# Patient Record
Sex: Male | Born: 1935 | Race: White | Hispanic: No | Smoking: Never smoker
Health system: Southern US, Community
[De-identification: ages and names within clinical notes are randomized; demographics above are authoritative.]

## PROBLEM LIST (undated history)

## (undated) DIAGNOSIS — F32A Depression, unspecified: Secondary | ICD-10-CM

## (undated) DIAGNOSIS — M1990 Unspecified osteoarthritis, unspecified site: Secondary | ICD-10-CM

## (undated) DIAGNOSIS — E0590 Thyrotoxicosis, unspecified without thyrotoxic crisis or storm: Secondary | ICD-10-CM

## (undated) DIAGNOSIS — I219 Acute myocardial infarction, unspecified: Secondary | ICD-10-CM

## (undated) DIAGNOSIS — K219 Gastro-esophageal reflux disease without esophagitis: Secondary | ICD-10-CM

## (undated) DIAGNOSIS — F329 Major depressive disorder, single episode, unspecified: Secondary | ICD-10-CM

## (undated) HISTORY — PX: INGUINAL HERNIA REPAIR: SHX194

## (undated) HISTORY — PX: ESOPHAGEAL DILATION: SHX303

## (undated) HISTORY — PX: EYE SURGERY: SHX253

---

## 1990-06-13 DIAGNOSIS — I219 Acute myocardial infarction, unspecified: Secondary | ICD-10-CM

## 1990-06-13 HISTORY — DX: Acute myocardial infarction, unspecified: I21.9

## 1998-12-29 ENCOUNTER — Ambulatory Visit (HOSPITAL_COMMUNITY): Admission: RE | Admit: 1998-12-29 | Discharge: 1998-12-29 | Payer: Self-pay | Admitting: Internal Medicine

## 1999-07-15 ENCOUNTER — Encounter: Payer: Self-pay | Admitting: Internal Medicine

## 1999-07-15 ENCOUNTER — Encounter: Admission: RE | Admit: 1999-07-15 | Discharge: 1999-07-15 | Payer: Self-pay | Admitting: Internal Medicine

## 1999-08-11 ENCOUNTER — Encounter: Payer: Self-pay | Admitting: Gastroenterology

## 1999-08-11 ENCOUNTER — Ambulatory Visit (HOSPITAL_COMMUNITY): Admission: RE | Admit: 1999-08-11 | Discharge: 1999-08-11 | Payer: Self-pay | Admitting: Gastroenterology

## 2008-05-02 ENCOUNTER — Ambulatory Visit (HOSPITAL_COMMUNITY): Admission: RE | Admit: 2008-05-02 | Discharge: 2008-05-02 | Payer: Self-pay | Admitting: General Surgery

## 2010-10-26 NOTE — Op Note (Signed)
NAME:  George, Reid NO.:  [id number]   MEDICAL RECORD NO.:  [id number]          PATIENT TYPE:  AMB   LOCATION:  DAY                          FACILITY:  [facility]   PHYSICIAN:  Adolph Pollack, M.D.DATE OF BIRTH:  [birth year]   DATE OF PROCEDURE:  05/02/2008  DATE OF DISCHARGE:                               OPERATIVE REPORT   PREOPERATIVE DIAGNOSIS:  Right inguinal hernia.   POSTOPERATIVE DIAGNOSIS:  Right inguinal hernia.   PROCEDURE:  Right inguinal hernia repair with mesh.   SURGEON:  Adolph Pollack, M.D.   ANESTHESIA:  General/LMA plus Marcaine local.   INDICATIONS:  This 77 year old male has noted a right inguinal bulge  that is somewhat bothersome to him.  He has an inguinal hernia on exam  and now presents for repair.  We have discussed the procedure, the risks  and aftercare preoperatively.   TECHNIQUE:  He was seen in the holding area and the right groin marked  my initials.  He was then brought to the operating room, placed supine  on the operating table and the anesthetic was administered.  The hair on  the right groin was clipped and the area was sterilely prepped and  draped.  Marcaine was infiltrated superficially and deep in the right  groin.  Right groin incision was made through the skin, subcutaneous  tissue and Scarpa fascia until the external oblique aponeurosis was  exposed.  Local anesthetic was infiltrated deep to the external oblique  aponeurosis.  Incision was made in the external oblique aponeurosis  through the external ring medially and up toward the anterior superior  iliac spine laterally.  Using blunt dissection the shelving edge of the  inguinal ligament was identified inferiorly and the internal oblique  aponeurosis identified superiorly.  The ilioinguinal nerve was  identified and retracted inferiorly out of the plane of dissection.   Using blunt dissection I isolated the spermatic cord and noted an  indirect sac  adherent to it.  I then dissected the indirect sac free  from the cord and reduced it through the patulous internal ring.   Following this a piece of 3 x 6 inches polypropylene mesh was brought  into the field and anchored 2 cm medial to the pubic tubercle with 2-0  Prolene suture.  The inferior aspect of the mesh was then anchored to  the shelving edge of the inguinal ligament with a running 2-0 Prolene  suture up to level 1-2 cm lateral to the internal ring.  A slit was cut  the mesh and two tails wrapped around the cord.  The superior aspect of  the mesh was then anchored to the internal oblique aponeurosis with  interrupted 2-0 Vicryl sutures.  Two tails of the mesh were crossed  creating a new internal ring and these were anchored to the shelving  edge of the inguinal ligament with 2-0 Prolene suture.  The tip of the  hemostat could be placed through the new aperture.   The lateral aspect of mesh was then tucked deep to the external oblique  aponeurosis.  The  cord and nerve root were placed in their usual  positions.  The external oblique aponeurosis was closed with running 3-0  Vicryl suture.  Ellamae Sia was closed with closed with running 2-0 Vicryl  suture.  The skin was closed with a 4-0 Monocryl subcuticular stitch,  followed by Steri-Strips and sterile dressing.   He tolerated the procedure without any apparent complications.  The  right testicle was in its normal position in the scrotum.  He was taken  to the recovery room in satisfactory condition.      Adolph Pollack, M.D.  Electronically Signed     TJR/MEDQ  D:  05/02/2008  T:  05/02/2008  Job:  [id number]

## 2011-03-15 LAB — COMPREHENSIVE METABOLIC PANEL
ALT: 20
AST: 31
Albumin: 4.5
Alkaline Phosphatase: 67
BUN: 15
CO2: 26
Calcium: 9.8
Chloride: 105
Creatinine, Ser: 1.07
GFR calc Af Amer: >60
GFR calc non Af Amer: >60
Glucose, Bld: 131 — ABNORMAL HIGH
Potassium: 3.9
Sodium: 140
Total Bilirubin: 0.7
Total Protein: 7.6

## 2011-03-15 LAB — DIFFERENTIAL
Basophils Absolute: 0
Basophils Relative: 1
Eosinophils Absolute: 0.1
Eosinophils Relative: 2
Lymphocytes Relative: 31
Lymphs Abs: 1.8
Monocytes Absolute: 0.5
Monocytes Relative: 8
Neutro Abs: 3.3
Neutrophils Relative %: 58

## 2011-03-15 LAB — CBC
HCT: 42.1
Hemoglobin: 14.2
MCHC: 33.7
MCV: 99.8
Platelets: 145 — ABNORMAL LOW
RBC: 4.22
RDW: 13
WBC: 5.7

## 2011-03-15 LAB — PROTIME-INR
INR: 1
Prothrombin Time: 13.2

## 2012-05-15 ENCOUNTER — Other Ambulatory Visit: Payer: Self-pay | Admitting: Neurosurgery

## 2012-05-15 ENCOUNTER — Encounter (HOSPITAL_COMMUNITY): Payer: Self-pay | Admitting: Pharmacy Technician

## 2012-05-17 ENCOUNTER — Encounter (HOSPITAL_COMMUNITY): Payer: Self-pay

## 2012-05-17 ENCOUNTER — Encounter (HOSPITAL_COMMUNITY)
Admission: RE | Admit: 2012-05-17 | Discharge: 2012-05-17 | Disposition: A | Discharge status: Home / Self Care | Payer: Medicare Other | Source: Ambulatory Visit | Attending: Neurosurgery | Admitting: Neurosurgery

## 2012-05-17 ENCOUNTER — Encounter (HOSPITAL_COMMUNITY)
Admission: RE | Admit: 2012-05-17 | Discharge: 2012-05-17 | Disposition: A | Discharge status: Home / Self Care | Payer: Medicare Other | Source: Ambulatory Visit | Attending: Anesthesiology | Admitting: Anesthesiology

## 2012-05-17 HISTORY — DX: Thyrotoxicosis, unspecified without thyrotoxic crisis or storm: E05.90

## 2012-05-17 HISTORY — DX: Acute myocardial infarction, unspecified: I21.9

## 2012-05-17 HISTORY — DX: Unspecified osteoarthritis, unspecified site: M19.90

## 2012-05-17 HISTORY — DX: Major depressive disorder, single episode, unspecified: F32.9

## 2012-05-17 HISTORY — DX: Gastro-esophageal reflux disease without esophagitis: K21.9

## 2012-05-17 HISTORY — DX: Depression, unspecified: F32.A

## 2012-05-17 LAB — CBC
MCH: 33.7 pg (ref 26.0–34.0)
Platelets: 182 10*3/uL (ref 150–400)
RBC: 3.59 MIL/uL — ABNORMAL LOW (ref 4.22–5.81)
WBC: 4.5 10*3/uL (ref 4.0–10.5)

## 2012-05-17 LAB — BASIC METABOLIC PANEL
Calcium: 9.6 mg/dL (ref 8.4–10.5)
GFR calc Af Amer: 84 mL/min — ABNORMAL LOW (ref >90)
GFR calc non Af Amer: 73 mL/min — ABNORMAL LOW (ref >90)
Glucose, Bld: 104 mg/dL — ABNORMAL HIGH (ref 70–99)
Potassium: 4.2 mEq/L (ref 3.5–5.1)
Sodium: 140 mEq/L (ref 135–145)

## 2012-05-17 LAB — SURGICAL PCR SCREEN
MRSA, PCR: NEGATIVE
Staphylococcus aureus: NEGATIVE

## 2012-05-17 MED ORDER — CEFAZOLIN SODIUM-DEXTROSE 2-3 GM-% IV SOLR
2.0000 g | INTRAVENOUS | Status: AC
  Administered 2012-05-18: 2 g via INTRAVENOUS
  Filled 2012-05-17: qty 50

## 2012-05-17 NOTE — Pre-Procedure Instructions (Signed)
20 George Reid  05/17/2012   Your procedure is scheduled on:  Friday, December 6th.  Report to [facility] at 8:15AM.  Call this number if you have problems the morning of surgery: [phone]   Remember:Nothing to eat or drink after Midnight.     Take these medicines the morning of surgery with A SIP OF WATER: Levothyroxine (Synthyroid), Omeprazole (Prilosec).                        Do not wear jewelry, make-up or nail polish.  Do not wear lotions, powders, or perfumes. You may wear deodorant.  Do not shave 48 hours prior to surgery. Men may shave face and neck.  Do not bring valuables to the hospital.  Contacts, dentures or bridgework may not be worn into surgery.  Leave suitcase in the car. After surgery it may be brought to your room.  For patients admitted to the hospital, checkout time is 11:00 AM the day of discharge.   Patients discharged the day of surgery will not be allowed to drive home.    Special Instructions: Shower with CHG wash (Bactoshield) tonight and again in the am prior to arriving to hospital.   Please read over the following fact sheets that you were given: Pain Booklet, Coughing and Deep Breathing and Surgical Site Infection Prevention

## 2012-05-18 ENCOUNTER — Encounter (HOSPITAL_COMMUNITY): Payer: Self-pay | Admitting: *Deleted

## 2012-05-18 ENCOUNTER — Inpatient Hospital Stay (HOSPITAL_COMMUNITY): Payer: Medicare Other

## 2012-05-18 ENCOUNTER — Encounter (HOSPITAL_COMMUNITY): Payer: Self-pay | Admitting: Certified Registered"

## 2012-05-18 ENCOUNTER — Inpatient Hospital Stay (HOSPITAL_COMMUNITY)
Admission: RE | Admit: 2012-05-18 | Discharge: 2012-05-19 | DRG: 491 | Disposition: A | Discharge status: Home / Self Care | Payer: Medicare Other | Source: Ambulatory Visit | Attending: Neurosurgery | Admitting: Neurosurgery

## 2012-05-18 ENCOUNTER — Encounter (HOSPITAL_COMMUNITY)
Admission: RE | Disposition: A | Discharge status: Home / Self Care | Payer: Self-pay | Source: Ambulatory Visit | Attending: Neurosurgery

## 2012-05-18 ENCOUNTER — Inpatient Hospital Stay (HOSPITAL_COMMUNITY): Payer: Medicare Other | Admitting: Certified Registered"

## 2012-05-18 DIAGNOSIS — Z7982 Long term (current) use of aspirin: Secondary | ICD-10-CM

## 2012-05-18 DIAGNOSIS — E0590 Thyrotoxicosis, unspecified without thyrotoxic crisis or storm: Secondary | ICD-10-CM | POA: Diagnosis present

## 2012-05-18 DIAGNOSIS — I10 Essential (primary) hypertension: Secondary | ICD-10-CM | POA: Diagnosis present

## 2012-05-18 DIAGNOSIS — M5126 Other intervertebral disc displacement, lumbar region: Principal | ICD-10-CM | POA: Diagnosis present

## 2012-05-18 DIAGNOSIS — K219 Gastro-esophageal reflux disease without esophagitis: Secondary | ICD-10-CM | POA: Diagnosis present

## 2012-05-18 DIAGNOSIS — Z79899 Other long term (current) drug therapy: Secondary | ICD-10-CM

## 2012-05-18 DIAGNOSIS — I252 Old myocardial infarction: Secondary | ICD-10-CM

## 2012-05-18 HISTORY — PX: LUMBAR LAMINECTOMY/DECOMPRESSION MICRODISCECTOMY: SHX5026

## 2012-05-18 SURGERY — LUMBAR LAMINECTOMY/DECOMPRESSION MICRODISCECTOMY 1 LEVEL
Anesthesia: General | Laterality: Right | Wound class: Clean

## 2012-05-18 MED ORDER — CYCLOBENZAPRINE HCL 10 MG PO TABS: 10.0000 mg | ORAL_TABLET | Freq: Three times a day (TID) | ORAL | Status: DC | PRN

## 2012-05-18 MED ORDER — HYDROMORPHONE HCL PF 1 MG/ML IJ SOLN: 0.2500 mg | INTRAMUSCULAR | Status: DC | PRN

## 2012-05-18 MED ORDER — MORPHINE SULFATE 2 MG/ML IJ SOLN: 1.0000 mg | INTRAMUSCULAR | Status: DC | PRN

## 2012-05-18 MED ORDER — ATORVASTATIN CALCIUM 40 MG PO TABS
40.0000 mg | ORAL_TABLET | Freq: Every day | ORAL | Status: DC
  Filled 2012-05-18: qty 1

## 2012-05-18 MED ORDER — LEVOTHYROXINE SODIUM 75 MCG PO TABS: 75.0000 ug | ORAL_TABLET | Freq: Every day | ORAL | Status: DC

## 2012-05-18 MED ORDER — GLYCOPYRROLATE 0.2 MG/ML IJ SOLN
INTRAMUSCULAR | Status: DC | PRN
  Administered 2012-05-18: 0.4 mg via INTRAVENOUS

## 2012-05-18 MED ORDER — HEMOSTATIC AGENTS (NO CHARGE) OPTIME
TOPICAL | Status: DC | PRN
  Administered 2012-05-18: 1 via TOPICAL

## 2012-05-18 MED ORDER — LIDOCAINE HCL (CARDIAC) 20 MG/ML IV SOLN
INTRAVENOUS | Status: DC | PRN
  Administered 2012-05-18: 80 mg via INTRAVENOUS

## 2012-05-18 MED ORDER — PROPOFOL 10 MG/ML IV BOLUS
INTRAVENOUS | Status: DC | PRN
  Administered 2012-05-18: 140 mg via INTRAVENOUS

## 2012-05-18 MED ORDER — 0.9 % SODIUM CHLORIDE (POUR BTL) OPTIME
TOPICAL | Status: DC | PRN
  Administered 2012-05-18: 1000 mL

## 2012-05-18 MED ORDER — ACETAMINOPHEN 10 MG/ML IV SOLN
1000.0000 mg | Freq: Four times a day (QID) | INTRAVENOUS | Status: DC
  Administered 2012-05-18 – 2012-05-19 (×3): 1000 mg via INTRAVENOUS
  Filled 2012-05-18 (×4): qty 100

## 2012-05-18 MED ORDER — LIDOCAINE-EPINEPHRINE 0.5 %-1:200000 IJ SOLN
INTRAMUSCULAR | Status: DC | PRN
  Administered 2012-05-18: 10 mL

## 2012-05-18 MED ORDER — SODIUM CHLORIDE 0.9 % IJ SOLN
3.0000 mL | Freq: Two times a day (BID) | INTRAMUSCULAR | Status: DC
  Administered 2012-05-18: 3 mL via INTRAVENOUS

## 2012-05-18 MED ORDER — POTASSIUM CHLORIDE IN NACL 20-0.9 MEQ/L-% IV SOLN
INTRAVENOUS | Status: DC
  Administered 2012-05-18: 18:00:00 via INTRAVENOUS
  Filled 2012-05-18 (×4): qty 1000

## 2012-05-18 MED ORDER — MENTHOL 3 MG MT LOZG: 1.0000 | LOZENGE | OROMUCOSAL | Status: DC | PRN

## 2012-05-18 MED ORDER — PANTOPRAZOLE SODIUM 40 MG PO TBEC
40.0000 mg | DELAYED_RELEASE_TABLET | Freq: Every day | ORAL | Status: DC
  Administered 2012-05-19: 40 mg via ORAL
  Filled 2012-05-18: qty 1

## 2012-05-18 MED ORDER — LIDOCAINE HCL 4 % MT SOLN
OROMUCOSAL | Status: DC | PRN
  Administered 2012-05-18: 4 mL via TOPICAL

## 2012-05-18 MED ORDER — HYDROCODONE-ACETAMINOPHEN 5-325 MG PO TABS: 1.0000 | ORAL_TABLET | Freq: Four times a day (QID) | ORAL | Status: DC | PRN

## 2012-05-18 MED ORDER — VERAPAMIL HCL ER 240 MG PO TBCR
240.0000 mg | EXTENDED_RELEASE_TABLET | Freq: Every day | ORAL | Status: DC
  Administered 2012-05-18: 240 mg via ORAL
  Filled 2012-05-18 (×2): qty 1

## 2012-05-18 MED ORDER — ROCURONIUM BROMIDE 100 MG/10ML IV SOLN
INTRAVENOUS | Status: DC | PRN
  Administered 2012-05-18: 40 mg via INTRAVENOUS

## 2012-05-18 MED ORDER — ARTIFICIAL TEARS OP OINT
TOPICAL_OINTMENT | OPHTHALMIC | Status: DC | PRN
  Administered 2012-05-18: 1 via OPHTHALMIC

## 2012-05-18 MED ORDER — ONDANSETRON HCL 4 MG/2ML IJ SOLN: 4.0000 mg | INTRAMUSCULAR | Status: DC | PRN

## 2012-05-18 MED ORDER — ASPIRIN 81 MG PO TABS: 81.0000 mg | ORAL_TABLET | Freq: Every day | ORAL | Status: DC

## 2012-05-18 MED ORDER — LACTATED RINGERS IV SOLN
INTRAVENOUS | Status: DC | PRN
  Administered 2012-05-18 (×2): via INTRAVENOUS

## 2012-05-18 MED ORDER — PHENOL 1.4 % MT LIQD: 1.0000 | OROMUCOSAL | Status: DC | PRN

## 2012-05-18 MED ORDER — ONDANSETRON HCL 4 MG/2ML IJ SOLN
INTRAMUSCULAR | Status: DC | PRN
  Administered 2012-05-18: 4 mg via INTRAVENOUS

## 2012-05-18 MED ORDER — NEOSTIGMINE METHYLSULFATE 1 MG/ML IJ SOLN
INTRAMUSCULAR | Status: DC | PRN
  Administered 2012-05-18: 3 mg via INTRAVENOUS

## 2012-05-18 MED ORDER — EPHEDRINE SULFATE 50 MG/ML IJ SOLN
INTRAMUSCULAR | Status: DC | PRN
  Administered 2012-05-18 (×3): 5 mg via INTRAVENOUS
  Administered 2012-05-18: 10 mg via INTRAVENOUS
  Administered 2012-05-18: 5 mg via INTRAVENOUS

## 2012-05-18 MED ORDER — ASPIRIN EC 81 MG PO TBEC
81.0000 mg | DELAYED_RELEASE_TABLET | Freq: Every day | ORAL | Status: DC
  Administered 2012-05-18: 81 mg via ORAL
  Filled 2012-05-18 (×2): qty 1

## 2012-05-18 MED ORDER — HYDROCODONE-ACETAMINOPHEN 5-325 MG PO TABS: 1.0000 | ORAL_TABLET | ORAL | Status: DC | PRN

## 2012-05-18 MED ORDER — SODIUM CHLORIDE 0.9 % IJ SOLN: 3.0000 mL | INTRAMUSCULAR | Status: DC | PRN

## 2012-05-18 MED ORDER — THROMBIN 5000 UNITS EX SOLR
CUTANEOUS | Status: DC | PRN
  Administered 2012-05-18 (×2): 5000 [IU] via TOPICAL

## 2012-05-18 MED ORDER — FENTANYL CITRATE 0.05 MG/ML IJ SOLN
INTRAMUSCULAR | Status: DC | PRN
  Administered 2012-05-18: 125 ug via INTRAVENOUS
  Administered 2012-05-18: 25 ug via INTRAVENOUS

## 2012-05-18 MED ORDER — KETOROLAC TROMETHAMINE 30 MG/ML IJ SOLN
15.0000 mg | Freq: Three times a day (TID) | INTRAMUSCULAR | Status: DC
  Administered 2012-05-18 – 2012-05-19 (×2): 15 mg via INTRAVENOUS
  Filled 2012-05-18 (×6): qty 1

## 2012-05-18 MED ORDER — ATORVASTATIN CALCIUM 20 MG PO TABS
20.0000 mg | ORAL_TABLET | Freq: Every day | ORAL | Status: DC
  Administered 2012-05-18: 20 mg via ORAL
  Filled 2012-05-18 (×2): qty 1

## 2012-05-18 MED ORDER — PHENYLEPHRINE HCL 10 MG/ML IJ SOLN
INTRAMUSCULAR | Status: DC | PRN
  Administered 2012-05-18 (×5): 40 ug via INTRAVENOUS

## 2012-05-18 MED ORDER — LEVOTHYROXINE SODIUM 75 MCG PO TABS
75.0000 ug | ORAL_TABLET | Freq: Every day | ORAL | Status: DC
  Administered 2012-05-19: 75 ug via ORAL
  Filled 2012-05-18 (×2): qty 1

## 2012-05-18 SURGICAL SUPPLY — 53 items
ADH SKN CLS APL DERMABOND .7 (GAUZE/BANDAGES/DRESSINGS) ×1
APL SKNCLS STERI-STRIP NONHPOA (GAUZE/BANDAGES/DRESSINGS)
BAG DECANTER FOR FLEXI CONT (MISCELLANEOUS) ×2 IMPLANT
BENZOIN TINCTURE PRP APPL 2/3 (GAUZE/BANDAGES/DRESSINGS) IMPLANT
BLADE SURG ROTATE 9660 (MISCELLANEOUS) IMPLANT
BUR MATCHSTICK NEURO 3.0 LAGG (BURR) ×2 IMPLANT
CANISTER SUCTION 2500CC (MISCELLANEOUS) ×2 IMPLANT
CLOTH BEACON ORANGE TIMEOUT ST (SAFETY) ×2 IMPLANT
CONT SPEC 4OZ CLIKSEAL STRL BL (MISCELLANEOUS) ×2 IMPLANT
DECANTER SPIKE VIAL GLASS SM (MISCELLANEOUS) ×2 IMPLANT
DERMABOND ADVANCED (GAUZE/BANDAGES/DRESSINGS) ×1
DERMABOND ADVANCED .7 DNX12 (GAUZE/BANDAGES/DRESSINGS) ×1 IMPLANT
DRAPE LAPAROTOMY 100X72X124 (DRAPES) ×2 IMPLANT
DRAPE MICROSCOPE LEICA (MISCELLANEOUS) ×2 IMPLANT
DRAPE POUCH INSTRU U-SHP 10X18 (DRAPES) ×2 IMPLANT
DRAPE SURG 17X23 STRL (DRAPES) ×2 IMPLANT
DURAPREP 26ML APPLICATOR (WOUND CARE) ×2 IMPLANT
ELECT REM PT RETURN 9FT ADLT (ELECTROSURGICAL) ×2
ELECTRODE REM PT RTRN 9FT ADLT (ELECTROSURGICAL) ×1 IMPLANT
GAUZE SPONGE 4X4 16PLY XRAY LF (GAUZE/BANDAGES/DRESSINGS) IMPLANT
GLOVE BIOGEL PI IND STRL 7.0 (GLOVE) IMPLANT
GLOVE BIOGEL PI INDICATOR 7.0 (GLOVE) ×1
GLOVE ECLIPSE 8.5 STRL (GLOVE) ×1 IMPLANT
GLOVE EXAM NITRILE LRG STRL (GLOVE) IMPLANT
GLOVE EXAM NITRILE MD LF STRL (GLOVE) IMPLANT
GLOVE EXAM NITRILE XL STR (GLOVE) IMPLANT
GLOVE EXAM NITRILE XS STR PU (GLOVE) IMPLANT
GLOVE OPTIFIT SS 6.5 STRL BRWN (GLOVE) ×2 IMPLANT
GOWN BRE IMP SLV AUR LG STRL (GOWN DISPOSABLE) ×4 IMPLANT
GOWN BRE IMP SLV AUR XL STRL (GOWN DISPOSABLE) ×1 IMPLANT
GOWN STRL REIN 2XL LVL4 (GOWN DISPOSABLE) IMPLANT
KIT BASIN OR (CUSTOM PROCEDURE TRAY) ×2 IMPLANT
KIT ROOM TURNOVER OR (KITS) ×2 IMPLANT
NDL HYPO 25X1 1.5 SAFETY (NEEDLE) ×1 IMPLANT
NDL SPNL 18GX3.5 QUINCKE PK (NEEDLE) IMPLANT
NEEDLE HYPO 25X1 1.5 SAFETY (NEEDLE) ×2 IMPLANT
NEEDLE SPNL 18GX3.5 QUINCKE PK (NEEDLE) IMPLANT
NS IRRIG 1000ML POUR BTL (IV SOLUTION) ×2 IMPLANT
PACK LAMINECTOMY NEURO (CUSTOM PROCEDURE TRAY) ×2 IMPLANT
PAD ARMBOARD 7.5X6 YLW CONV (MISCELLANEOUS) ×6 IMPLANT
RUBBERBAND STERILE (MISCELLANEOUS) ×4 IMPLANT
SPONGE GAUZE 4X4 12PLY (GAUZE/BANDAGES/DRESSINGS) IMPLANT
SPONGE LAP 4X18 X RAY DECT (DISPOSABLE) IMPLANT
SPONGE SURGIFOAM ABS GEL SZ50 (HEMOSTASIS) ×2 IMPLANT
STRIP CLOSURE SKIN 1/2X4 (GAUZE/BANDAGES/DRESSINGS) IMPLANT
SUT VIC AB 0 CT1 18XCR BRD8 (SUTURE) ×1 IMPLANT
SUT VIC AB 0 CT1 8-18 (SUTURE) ×2
SUT VIC AB 2-0 CT1 18 (SUTURE) ×2 IMPLANT
SUT VIC AB 3-0 SH 8-18 (SUTURE) ×2 IMPLANT
SYR 20ML ECCENTRIC (SYRINGE) ×2 IMPLANT
TOWEL OR 17X24 6PK STRL BLUE (TOWEL DISPOSABLE) ×2 IMPLANT
TOWEL OR 17X26 10 PK STRL BLUE (TOWEL DISPOSABLE) ×2 IMPLANT
WATER STERILE IRR 1000ML POUR (IV SOLUTION) ×2 IMPLANT

## 2012-05-18 NOTE — Plan of Care (Signed)
Problem: Consults Goal: Diagnosis - Spinal Surgery Outcome: Completed/Met Date Met:  05/18/12 Microdiscectomy     

## 2012-05-18 NOTE — Anesthesia Postprocedure Evaluation (Signed)
  Anesthesia Post-op Note  Patient: George Reid  Procedure(s) Performed: Procedure(s) (LRB) with comments: LUMBAR LAMINECTOMY/DECOMPRESSION MICRODISCECTOMY 1 LEVEL (Right) - RIGHT Lumbar four-five diskectomy  Patient Location: PACU  Anesthesia Type:General  Level of Consciousness: awake  Airway and Oxygen Therapy: Patient Spontanous Breathing  Post-op Pain: mild  Post-op Assessment: Post-op Vital signs reviewed  Post-op Vital Signs: Reviewed  Complications: No apparent anesthesia complications

## 2012-05-18 NOTE — Progress Notes (Signed)
Orthopedic Tech Progress Note Patient Details:  George Reid 1925-02-12 [id number] Called bio-tech for lumbar corsett talked to [place]. Patient ID: George Reid, male   DOB: 29-Mar-1925, 78 y.o.   MRN: [id number]   Jennye Moccasin 05/18/2012, 6:12 PM

## 2012-05-18 NOTE — Transfer of Care (Signed)
Immediate Anesthesia Transfer of Care Note  Patient: George Reid  Procedure(s) Performed: Procedure(s) (LRB) with comments: LUMBAR LAMINECTOMY/DECOMPRESSION MICRODISCECTOMY 1 LEVEL (Right) - RIGHT Lumbar four-five diskectomy  Patient Location: PACU  Anesthesia Type:General  Level of Consciousness: awake and alert   Airway & Oxygen Therapy: Patient Spontanous Breathing and Patient connected to nasal cannula oxygen  Post-op Assessment: Report given to PACU RN, Post -op Vital signs reviewed and stable and Patient moving all extremities  Post vital signs: Reviewed and stable  Complications: No apparent anesthesia complications

## 2012-05-18 NOTE — Anesthesia Preprocedure Evaluation (Addendum)
Anesthesia Evaluation  Patient identified by MRN, date of birth, ID band Patient awake    Airway Mallampati: II TM Distance: >3 FB Neck ROM: Full    Dental  (+) Edentulous Upper and Edentulous Lower   Pulmonary neg pulmonary ROS,  breath sounds clear to auscultation        Cardiovascular + Past MI Rhythm:Regular Rate:Normal     Neuro/Psych    GI/Hepatic Neg liver ROS, GERD-  Medicated and Controlled,  Endo/Other  Hyperthyroidism   Renal/GU negative Renal ROS     Musculoskeletal   Abdominal   Peds  Hematology   Anesthesia Other Findings   Reproductive/Obstetrics                          Anesthesia Physical Anesthesia Plan  ASA: III  Anesthesia Plan: General   Post-op Pain Management:    Induction: Intravenous  Airway Management Planned: Oral ETT  Additional Equipment:   Intra-op Plan:   Post-operative Plan: Extubation in OR  Informed Consent: I have reviewed the patients History and Physical, chart, labs and discussed the procedure including the risks, benefits and alternatives for the proposed anesthesia with the patient or authorized representative who has indicated his/her understanding and acceptance.     Plan Discussed with: Anesthesiologist, CRNA and Surgeon  Anesthesia Plan Comments:         Anesthesia Quick Evaluation

## 2012-05-18 NOTE — Preoperative (Signed)
Beta Blockers   Reason not to administer Beta Blockers:Not Applicable 

## 2012-05-18 NOTE — Discharge Summary (Signed)
Physician Discharge Summary  Patient ID: George Reid MRN: [id number] DOB/AGE: [birth year] 78 y.o.  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 *    Discharged Condition: good  Hospital Course: [name] was admitted for a lumbar discetomy at L4/5 on the right. Post op he has done very well. Wound is clean dry and without signs of infection. He has voided, walked and tolerated a regular diet. He feels better in his right lower extremity.  Consults: None  Significant Diagnostic Studies: none  Treatments: surgery: Right L4 semihemilaminectomy and discetomy  Discharge Exam: Blood pressure 159/66, pulse 68, temperature 98 F (36.7 C), temperature source Oral, resp. rate 18, SpO2 98.00%. General appearance: alert, cooperative, appears stated age and no distress Neurologic: Alert and oriented X 3, normal strength and tone. Normal symmetric reflexes. Normal coordination and gait  Disposition: Final discharge disposition not confirmed     Medication List     As of 05/18/2012  8:48 PM    TAKE these medications         aspirin 81 MG tablet   Take 81 mg by mouth daily.      cyclobenzaprine 10 MG tablet   Commonly known as: FLEXERIL   Take 1 tablet (10 mg total) by mouth 3 (three) times daily as needed for muscle spasms.      HYDROcodone-acetaminophen 5-325 MG per tablet   Commonly known as: NORCO/VICODIN   Take 1-2 tablets by mouth every 6 (six) hours as needed for pain.      ibuprofen 200 MG tablet   Commonly known as: ADVIL,MOTRIN   Take 200 mg by mouth every 6 (six) hours as needed. For pain.      levothyroxine 75 MCG tablet   Commonly known as: SYNTHROID, LEVOTHROID   Take 75 mcg by mouth daily.      omeprazole 20 MG capsule   Commonly known as: PRILOSEC   Take 20 mg by mouth daily.      simvastatin 80 MG tablet   Commonly  known as: ZOCOR   Take 40 mg by mouth at bedtime.      verapamil 240 MG CR tablet   Commonly known as: CALAN-SR   Take 240 mg by mouth at bedtime.           Follow-up Information    Follow up with Quadarius Henton L, MD. In 4 weeks. (call to make an appt)    Contact information:   [address]                         [address] [place] [address] [phone]          Signed: Katalin Colledge L 05/18/2012, 8:48 PM

## 2012-05-18 NOTE — H&P (Signed)
BP 186/87  Pulse 80  Temp 98.4 F (36.9 C) (Oral)  Resp 18  SpO2 100%  George Reid is an 78 year old gentleman who presents today for evaluation of pain that he has in the right lower extremity, especially in his right foot with numbness in between the first and second digits of his right foot.  He also has some mild weakness in the dorsiflexors of his right foot.  He says most of the pain he has is in the right leg.  He has absolutely no discomfort whatsoever in his left lower extremity.  He has had this discomfort now for a few weeks.  He has tried to downplay this to some degree, but his daughter is with him and is trying to keep on the straight and narrow.  But, she says quite honestly that he has a great deal of stubbornness in him and gave me an example of him having a heart attack and not being willing to go to the hospital for about 15 hours.  Mr. Weick has been seen by Dr. Jacky Kindle for this right leg pain since I believe September and a full and extensive workup of the right lower extremity has been performed.  He states that he has had back pain for a long period of time and that the pain that he is describing now is not new.  But, his daughter states that he has a great deal of discomfort, which is easily seen when he is walking.  He has had no bowel or bladder dysfunction that he admits to.  He is retired and right-handed.  He does have weakness in his leg.  He does have numbness and tingling in the right foot.    MEDICATIONS:    He currently takes Omeprazole, Verapamil, Synthroid, Simvastatin and a Baby Aspirin.    PAST MEDICAL HISTORY:  Myocardial infarction, hypertension and reflux.    FAMILY HISTORY:    Mother and father are both deceased.  Parkinson's and heart disease present in the family history.  His wife passed away 5 years ago.    PAST SURGICAL HISTORY:  He has undergone a herniorrhaphy.    DRUG ALLERGIES:    No known drug allergies.    SOCIAL HISTORY:    He does not  smoke, does not use alcohol, does not use illicit drugs.  He is 172 cm. in height.  He is 134 lbs.  He has a pulse of 64.    REVIEW OF SYSTEMS:   Positive for weight loss, eyeglasses, balance problems, hypertension, hypercholesterolemia, leg pain with walking, indigestion, leg weakness,   Rishaan Gunner  #[id number]  DOB:  Oct 12, 1924    April 24, 2012  Page Two   back pain, leg pain, arthritis.  He denies allergic, hematologic, endocrine, psychiatric, neurological, skin, genitourinary, respiratory problems.    EXAMINATION:    On examination he is alert, oriented 4 and answering all questions appropriately.  He is quite thin.  He has 5/5 strength in both upper and left lower extremity.  Mild weakness in the right extensor hallucis longus and the right dorsiflexors.  Proprioception is intact.  No clonus.  No Hoffmann's sign.  Toes are downgoing to plantar stimulation.  Difficulty with heel walking on the right.  The right foot slaps down.  None on the left.  Gait is slightly unsteady.  Romberg test is positive.  Pupils are equal, round and reactive to light.  Full extraocular movements.  Full visual fields.  Symmetric facial  sensation and movement.  Hearing intact to finger rub.  Uvula elevates in the midline.  Shoulder shrug is normal.  Tongue protrudes in the midline.  No cervical masses or bruits.  Lung fields are clear.  Heart regular rhythm and rate.  No murmurs or rubs.  Pulse is good at the wrists bilaterally.  Sclera not injected.  Oral mucosa is normal.  Head normocephalic, atraumatic.    DIAGNOSTIC STUDIES:   MRI is reviewed.  It shows significant amount of spondylitic and degenerative change in the lumbar spine.  Exaggerated kyphosis at the thoracolumbar junction.  But, what he does have, which is quite noticeable, is a very large disc herniation and fragment on the right side at L4-5 causing significant compression of the thecal sac and stenosis and compression of the right L5 root.  No herniated  disc at the other levels.  Conus is normal.  Cauda equina is normal.  Paraspinous soft tissues are normal.    SUMMARY:     I believe that Mr. Harnois would best be served with a lumbar diskectomy.  The reason for that is he does have weakness in the dorsiflexion and he is a vital, vigorous gentleman at the age of 36. He has opted for an operative decompression of the disc herniation. Risks including bleeding, infection, no pain relief, weakness in the lower extremities, lower extremity weakness, disc recurrence, and others were discussed. He understands and wishes to proceed.

## 2012-05-18 NOTE — Op Note (Signed)
05/18/2012  2:18 PM  PATIENT:  George Reid  78 y.o. male with severe pain in the right lower extremity and a large disc fragment which migrated caudal to the disc space.   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 He was positioned prone on a Wilson frame with all pressure points padded. His back was prepped and draped in a sterile manner. I infiltrated lidocaine into my planned incision in the lumbar region.  I opened the skin with a 10 blade and then exposed the lamina of L4 and L5 in a subperiosteal dissection. I confirmed my location with an intraoperative xray. I used the drill to perform a semihemilaminectomy of L4 on the right. I was able to then dissect the ligamentum flavum from its attachment to the lamina. I removed it to expose the thecal sac. With microdissection I was able to retract the thecal sac mediAlly and identify the disc space. I opened the annulus just caudal to the disc space and with a hook started to remove the disc fragment. I along with Dr. Jordan Likes removed the fragment and decompressed the right L5 root.  I did not have to go into the disc space with rongeurs. We were able to free the nerve root of any appreciable pressure. I inspected rostrally, caudally, laterally, and medially to the nerve root and felt the decompression was complete. I  irrigated the wound. I closed the wound in layers approximating the thoracolumbar fascia,  Subcutaneous, and subcuticular planes. I used dermabond for a sterile dressing. I used vicryl sutures to close.   PLAN OF CARE: Admit to inpatient   PATIENT DISPOSITION:  PACU - hemodynamically stable.   Delay start of Pharmacological VTE agent (>24hrs) due to surgical blood loss or risk of bleeding:  yes

## 2012-05-18 NOTE — Anesthesia Procedure Notes (Signed)
Procedure Name: Intubation Date/Time: 05/18/2012 12:03 PM Performed by: Rogelia Boga Pre-anesthesia Checklist: Patient identified, Emergency Drugs available, Suction available, Patient being monitored and Timeout performed Patient Re-evaluated:Patient Re-evaluated prior to inductionOxygen Delivery Method: Circle system utilized Preoxygenation: Pre-oxygenation with 100% oxygen Intubation Type: IV induction Ventilation: Mask ventilation without difficulty and Oral airway inserted - appropriate to patient size Laryngoscope Size: Mac and 4 Grade View: Grade I Tube type: Oral Tube size: 7.5 mm Number of attempts: 1 Airway Equipment and Method: Stylet Placement Confirmation: ETT inserted through vocal cords under direct vision,  positive ETCO2 and breath sounds checked- equal and bilateral Secured at: 22 cm Tube secured with: Tape Dental Injury: Teeth and Oropharynx as per pre-operative assessment

## 2012-05-19 NOTE — Progress Notes (Signed)
Orthopedic Tech Progress Note Patient Details:  George Reid 05-Nov-1924 [id number] [employer] order completed Patient ID: George Reid, male   DOB: 04-17-1925, 78 y.o.   MRN: [id number]   [name] 05/19/2012, 9:49 AM

## 2012-05-21 ENCOUNTER — Encounter (HOSPITAL_COMMUNITY): Payer: Self-pay | Admitting: Neurosurgery

## 2015-01-10 ENCOUNTER — Emergency Department (HOSPITAL_COMMUNITY)
Admission: EM | Admit: 2015-01-10 | Discharge: 2015-01-10 | Disposition: A | Discharge status: Home / Self Care | Payer: Medicare Other | Source: Home / Self Care

## 2015-01-10 ENCOUNTER — Encounter (HOSPITAL_COMMUNITY): Payer: Self-pay | Admitting: *Deleted

## 2015-01-10 ENCOUNTER — Emergency Department (INDEPENDENT_AMBULATORY_CARE_PROVIDER_SITE_OTHER): Payer: Medicare Other

## 2015-01-10 DIAGNOSIS — M549 Dorsalgia, unspecified: Secondary | ICD-10-CM

## 2015-01-10 MED ORDER — HYDROCODONE-ACETAMINOPHEN 5-325 MG PO TABS: 1.0000 | ORAL_TABLET | Freq: Four times a day (QID) | ORAL | Status: DC | PRN

## 2015-01-10 NOTE — ED Provider Notes (Signed)
CSN: [id number]     Arrival date & time 01/10/15  1312 History   None    Chief Complaint  Patient presents with  . Fall   (Consider location/radiation/quality/duration/timing/severity/associated sxs/prior Treatment) Patient is a 81 y.o. male presenting with fall. The history is provided by the patient and a relative.  Fall This is a new problem. The current episode started more than 2 days ago (up on ladder and fell approx 6-8 feet landing on chest, c/o back pain, no ext injury.). The problem has not changed since onset.Pertinent negatives include no chest pain, no abdominal pain and no shortness of breath. Associated symptoms comments: Back pain and lack of bm..    Past Medical History  Diagnosis Date  . Myocardial infarction 1992  . Hyperthyroidism   . Depression   . GERD (gastroesophageal reflux disease)   . Arthritis    Past Surgical History  Procedure Laterality Date  . Inguinal hernia repair      Right  . Esophageal dilation      x2- years ago- refused to do it again- states that Prilosec helps with the choaking.  . Eye surgery      cataract bil  . Lumbar laminectomy/decompression microdiscectomy  05/18/2012    Procedure: LUMBAR LAMINECTOMY/DECOMPRESSION MICRODISCECTOMY 1 LEVEL;  Surgeon: Winfield Cunas, MD;  [facility];  Service: Neurosurgery;  Laterality: Right;  RIGHT Lumbar four-five diskectomy   History reviewed. No pertinent family history. History  Substance Use Topics  . Smoking status: Never Smoker   . Smokeless tobacco: Not on file  . Alcohol Use: No    Review of Systems  Constitutional: Negative.   HENT: Negative.   Respiratory: Negative for shortness of breath.   Cardiovascular: Negative.  Negative for chest pain.  Gastrointestinal: Negative.  Negative for abdominal pain.  Musculoskeletal: Positive for back pain. Negative for gait problem.  Skin: Negative.   Neurological: Negative.     Allergies  Review of patient's allergies indicates  no known allergies.  Home Medications   Prior to Admission medications   Medication Sig Start Date End Date Taking? Authorizing Provider  aspirin 81 MG tablet Take 81 mg by mouth daily.    Historical Provider, MD  cyclobenzaprine (FLEXERIL) 10 MG tablet Take 1 tablet (10 mg total) by mouth 3 (three) times daily as needed for muscle spasms. 05/18/12   Ashok Pall, MD  HYDROcodone-acetaminophen (NORCO/VICODIN) 5-325 MG per tablet Take 1 tablet by mouth every 6 (six) hours as needed. For pain 01/10/15   Billy Fischer, MD  ibuprofen (ADVIL,MOTRIN) 200 MG tablet Take 200 mg by mouth every 6 (six) hours as needed. For pain.    Historical Provider, MD  levothyroxine (SYNTHROID, LEVOTHROID) 75 MCG tablet Take 75 mcg by mouth daily.    Historical Provider, MD  omeprazole (PRILOSEC) 20 MG capsule Take 20 mg by mouth daily.    Historical Provider, MD  simvastatin (ZOCOR) 80 MG tablet Take 40 mg by mouth at bedtime.    Historical Provider, MD  verapamil (CALAN-SR) 240 MG CR tablet Take 240 mg by mouth at bedtime.    Historical Provider, MD   BP 219/95 mmHg  Pulse 75  Temp(Src) 98 F (36.7 C) (Oral)  Resp 16  SpO2 98% Physical Exam  Constitutional: He is oriented to person, place, and time. He appears well-developed and well-nourished.  HENT:  Head: Normocephalic and atraumatic.  Cardiovascular: Normal heart sounds.   Pulmonary/Chest: Breath sounds normal.  Abdominal: Soft. Bowel sounds are  normal. He exhibits no distension and no mass. There is no tenderness. There is no rebound and no guarding.  Musculoskeletal: He exhibits tenderness.       Lumbar back: He exhibits decreased range of motion, tenderness, bony tenderness and pain. He exhibits no spasm and normal pulse.       Back:  Neurological: He is alert and oriented to person, place, and time.  Skin: Skin is warm and dry.  Nursing note and vitals reviewed.   ED Course  Procedures (including critical care time) Labs Review Labs Reviewed  - No data to display  Imaging Review Dg Lumbar Spine Complete  01/10/2015   ADDENDUM REPORT: 01/10/2015 14:53  ADDENDUM: On additional review, there is a mild superior endplate compression deformity involving T12, age indeterminate. This is new when compared to 2013. No retropulsion.  ADDENDED IMPRESSION:  Mild superior endplate compression deformity involving T12, age indeterminate, new from 2013. Correlate for point tenderness to exclude acute fracture.  No evidence of fracture or dislocation in the lumbar spine.  Moderate degenerative changes.   Electronically Signed   By: Julian Hy M.D.   On: 01/10/2015 14:53   01/10/2015   CLINICAL DATA:  Fall off ladder, low back pain  EXAM: LUMBAR SPINE - COMPLETE 4+ VIEW  COMPARISON:  Outside hospital MRI lumbar spine dated 04/13/2012  FINDINGS: Five lumbar type vertebral bodies.  Reversal the normal upper lumbar lordosis.  No evidence of fracture or dislocation. Vertebral body heights are maintained.  Moderate multilevel degenerative changes.  Visualized bony pelvis appears intact.  Vascular calcifications.  Additional calcifications overlying the right mid abdomen may reflect calcified gallstones.  IMPRESSION: No fracture or dislocation is seen.  Moderate degenerative changes of the lumbar spine.  Electronically Signed: By: Julian Hy M.D. On: 01/10/2015 14:34    X-rays reviewed and report per radiologist.  MDM   1. Back pain, acute        Billy Fischer, MD 01/10/15 1501

## 2015-01-10 NOTE — Discharge Instructions (Signed)
Heat, activity as tolerated, use pain medicine and laxative as needed, see orthopedist and your doctor next week for recheck.

## 2015-01-10 NOTE — ED Notes (Signed)
Pt  Fell  approx  8  Feet     From a  ladder  4  Days  Ago  He  Reports     Back  Pain    no  Known  Loss  Of  concoussness      Awake  And  Alert  At  This time

## 2016-06-21 DIAGNOSIS — Z08 Encounter for follow-up examination after completed treatment for malignant neoplasm: Secondary | ICD-10-CM | POA: Diagnosis not present

## 2016-06-21 DIAGNOSIS — L0100 Impetigo, unspecified: Secondary | ICD-10-CM | POA: Diagnosis not present

## 2016-06-21 DIAGNOSIS — X32XXXD Exposure to sunlight, subsequent encounter: Secondary | ICD-10-CM | POA: Diagnosis not present

## 2016-06-21 DIAGNOSIS — Z85828 Personal history of other malignant neoplasm of skin: Secondary | ICD-10-CM | POA: Diagnosis not present

## 2016-06-21 DIAGNOSIS — L570 Actinic keratosis: Secondary | ICD-10-CM | POA: Diagnosis not present

## 2016-09-07 DIAGNOSIS — Z681 Body mass index (BMI) 19 or less, adult: Secondary | ICD-10-CM | POA: Diagnosis not present

## 2016-09-07 DIAGNOSIS — Z1389 Encounter for screening for other disorder: Secondary | ICD-10-CM | POA: Diagnosis not present

## 2016-09-07 DIAGNOSIS — E46 Unspecified protein-calorie malnutrition: Secondary | ICD-10-CM | POA: Diagnosis not present

## 2016-09-07 DIAGNOSIS — E784 Other hyperlipidemia: Secondary | ICD-10-CM | POA: Diagnosis not present

## 2016-09-07 DIAGNOSIS — I252 Old myocardial infarction: Secondary | ICD-10-CM | POA: Diagnosis not present

## 2016-09-07 DIAGNOSIS — E038 Other specified hypothyroidism: Secondary | ICD-10-CM | POA: Diagnosis not present

## 2016-09-07 DIAGNOSIS — M545 Low back pain: Secondary | ICD-10-CM | POA: Diagnosis not present

## 2016-09-07 DIAGNOSIS — M1990 Unspecified osteoarthritis, unspecified site: Secondary | ICD-10-CM | POA: Diagnosis not present

## 2016-09-07 DIAGNOSIS — K219 Gastro-esophageal reflux disease without esophagitis: Secondary | ICD-10-CM | POA: Diagnosis not present

## 2016-09-07 DIAGNOSIS — I2510 Atherosclerotic heart disease of native coronary artery without angina pectoris: Secondary | ICD-10-CM | POA: Diagnosis not present

## 2016-09-07 DIAGNOSIS — R413 Other amnesia: Secondary | ICD-10-CM | POA: Diagnosis not present

## 2016-09-07 DIAGNOSIS — I10 Essential (primary) hypertension: Secondary | ICD-10-CM | POA: Diagnosis not present

## 2016-09-27 DIAGNOSIS — L720 Epidermal cyst: Secondary | ICD-10-CM | POA: Diagnosis not present

## 2016-10-11 DIAGNOSIS — C44229 Squamous cell carcinoma of skin of left ear and external auricular canal: Secondary | ICD-10-CM | POA: Diagnosis not present

## 2016-10-18 DIAGNOSIS — C44229 Squamous cell carcinoma of skin of left ear and external auricular canal: Secondary | ICD-10-CM | POA: Diagnosis not present

## 2016-11-14 DIAGNOSIS — C44229 Squamous cell carcinoma of skin of left ear and external auricular canal: Secondary | ICD-10-CM | POA: Insufficient documentation

## 2016-11-22 DIAGNOSIS — E038 Other specified hypothyroidism: Secondary | ICD-10-CM | POA: Diagnosis not present

## 2016-11-22 DIAGNOSIS — Z01818 Encounter for other preprocedural examination: Secondary | ICD-10-CM | POA: Diagnosis not present

## 2016-11-22 DIAGNOSIS — I2510 Atherosclerotic heart disease of native coronary artery without angina pectoris: Secondary | ICD-10-CM | POA: Diagnosis not present

## 2016-11-22 DIAGNOSIS — Z681 Body mass index (BMI) 19 or less, adult: Secondary | ICD-10-CM | POA: Diagnosis not present

## 2016-12-06 DIAGNOSIS — Z01810 Encounter for preprocedural cardiovascular examination: Secondary | ICD-10-CM | POA: Diagnosis not present

## 2016-12-06 DIAGNOSIS — E785 Hyperlipidemia, unspecified: Secondary | ICD-10-CM | POA: Diagnosis not present

## 2016-12-06 DIAGNOSIS — I10 Essential (primary) hypertension: Secondary | ICD-10-CM | POA: Diagnosis not present

## 2016-12-06 DIAGNOSIS — I2510 Atherosclerotic heart disease of native coronary artery without angina pectoris: Secondary | ICD-10-CM | POA: Diagnosis not present

## 2016-12-08 DIAGNOSIS — Z01810 Encounter for preprocedural cardiovascular examination: Secondary | ICD-10-CM | POA: Diagnosis not present

## 2016-12-08 DIAGNOSIS — R0602 Shortness of breath: Secondary | ICD-10-CM | POA: Diagnosis not present

## 2016-12-08 DIAGNOSIS — I2510 Atherosclerotic heart disease of native coronary artery without angina pectoris: Secondary | ICD-10-CM | POA: Diagnosis not present

## 2016-12-27 DIAGNOSIS — Z01810 Encounter for preprocedural cardiovascular examination: Secondary | ICD-10-CM | POA: Diagnosis not present

## 2016-12-27 DIAGNOSIS — I2510 Atherosclerotic heart disease of native coronary artery without angina pectoris: Secondary | ICD-10-CM | POA: Diagnosis not present

## 2016-12-27 DIAGNOSIS — I255 Ischemic cardiomyopathy: Secondary | ICD-10-CM | POA: Diagnosis not present

## 2016-12-27 DIAGNOSIS — R55 Syncope and collapse: Secondary | ICD-10-CM | POA: Diagnosis not present

## 2017-01-25 DIAGNOSIS — R55 Syncope and collapse: Secondary | ICD-10-CM | POA: Diagnosis not present

## 2017-03-01 DIAGNOSIS — Z125 Encounter for screening for malignant neoplasm of prostate: Secondary | ICD-10-CM | POA: Diagnosis not present

## 2017-03-01 DIAGNOSIS — E038 Other specified hypothyroidism: Secondary | ICD-10-CM | POA: Diagnosis not present

## 2017-03-01 DIAGNOSIS — E784 Other hyperlipidemia: Secondary | ICD-10-CM | POA: Diagnosis not present

## 2017-03-01 DIAGNOSIS — I10 Essential (primary) hypertension: Secondary | ICD-10-CM | POA: Diagnosis not present

## 2017-03-15 DIAGNOSIS — I2510 Atherosclerotic heart disease of native coronary artery without angina pectoris: Secondary | ICD-10-CM | POA: Diagnosis not present

## 2017-03-15 DIAGNOSIS — I252 Old myocardial infarction: Secondary | ICD-10-CM | POA: Diagnosis not present

## 2017-03-15 DIAGNOSIS — Z0000 Encounter for general adult medical examination without abnormal findings: Secondary | ICD-10-CM | POA: Diagnosis not present

## 2017-03-15 DIAGNOSIS — Z23 Encounter for immunization: Secondary | ICD-10-CM | POA: Diagnosis not present

## 2017-03-15 DIAGNOSIS — Z681 Body mass index (BMI) 19 or less, adult: Secondary | ICD-10-CM | POA: Diagnosis not present

## 2017-03-15 DIAGNOSIS — I10 Essential (primary) hypertension: Secondary | ICD-10-CM | POA: Diagnosis not present

## 2017-03-15 DIAGNOSIS — M546 Pain in thoracic spine: Secondary | ICD-10-CM | POA: Diagnosis not present

## 2017-03-15 DIAGNOSIS — E038 Other specified hypothyroidism: Secondary | ICD-10-CM | POA: Diagnosis not present

## 2017-03-15 DIAGNOSIS — Z1389 Encounter for screening for other disorder: Secondary | ICD-10-CM | POA: Diagnosis not present

## 2017-03-15 DIAGNOSIS — E7849 Other hyperlipidemia: Secondary | ICD-10-CM | POA: Diagnosis not present

## 2017-03-15 DIAGNOSIS — E46 Unspecified protein-calorie malnutrition: Secondary | ICD-10-CM | POA: Diagnosis not present

## 2017-03-15 DIAGNOSIS — D049 Carcinoma in situ of skin, unspecified: Secondary | ICD-10-CM | POA: Diagnosis not present

## 2017-08-12 ENCOUNTER — Inpatient Hospital Stay (HOSPITAL_COMMUNITY)
Admission: EM | Admit: 2017-08-12 | Discharge: 2017-08-15 | DRG: 660 | Disposition: A | Discharge status: Home / Self Care | Payer: PPO | Attending: Internal Medicine | Admitting: Internal Medicine

## 2017-08-12 ENCOUNTER — Encounter (HOSPITAL_COMMUNITY): Payer: Self-pay | Admitting: Emergency Medicine

## 2017-08-12 ENCOUNTER — Emergency Department (HOSPITAL_COMMUNITY): Payer: PPO

## 2017-08-12 DIAGNOSIS — N201 Calculus of ureter: Secondary | ICD-10-CM

## 2017-08-12 DIAGNOSIS — Z79899 Other long term (current) drug therapy: Secondary | ICD-10-CM | POA: Diagnosis not present

## 2017-08-12 DIAGNOSIS — I4891 Unspecified atrial fibrillation: Secondary | ICD-10-CM | POA: Diagnosis not present

## 2017-08-12 DIAGNOSIS — N132 Hydronephrosis with renal and ureteral calculous obstruction: Secondary | ICD-10-CM

## 2017-08-12 DIAGNOSIS — R1031 Right lower quadrant pain: Secondary | ICD-10-CM | POA: Diagnosis not present

## 2017-08-12 DIAGNOSIS — Z79890 Hormone replacement therapy (postmenopausal): Secondary | ICD-10-CM | POA: Diagnosis not present

## 2017-08-12 DIAGNOSIS — Y838 Other surgical procedures as the cause of abnormal reaction of the patient, or of later complication, without mention of misadventure at the time of the procedure: Secondary | ICD-10-CM | POA: Diagnosis not present

## 2017-08-12 DIAGNOSIS — Z681 Body mass index (BMI) 19 or less, adult: Secondary | ICD-10-CM | POA: Diagnosis not present

## 2017-08-12 DIAGNOSIS — E440 Moderate protein-calorie malnutrition: Secondary | ICD-10-CM | POA: Diagnosis present

## 2017-08-12 DIAGNOSIS — I97191 Other postprocedural cardiac functional disturbances following other surgery: Secondary | ICD-10-CM | POA: Diagnosis not present

## 2017-08-12 DIAGNOSIS — N289 Disorder of kidney and ureter, unspecified: Secondary | ICD-10-CM | POA: Diagnosis present

## 2017-08-12 DIAGNOSIS — K219 Gastro-esophageal reflux disease without esophagitis: Secondary | ICD-10-CM | POA: Diagnosis present

## 2017-08-12 DIAGNOSIS — I77811 Abdominal aortic ectasia: Secondary | ICD-10-CM | POA: Diagnosis present

## 2017-08-12 DIAGNOSIS — R269 Unspecified abnormalities of gait and mobility: Secondary | ICD-10-CM | POA: Diagnosis not present

## 2017-08-12 DIAGNOSIS — I491 Atrial premature depolarization: Secondary | ICD-10-CM | POA: Diagnosis not present

## 2017-08-12 DIAGNOSIS — I2510 Atherosclerotic heart disease of native coronary artery without angina pectoris: Secondary | ICD-10-CM | POA: Diagnosis present

## 2017-08-12 DIAGNOSIS — F329 Major depressive disorder, single episode, unspecified: Secondary | ICD-10-CM | POA: Diagnosis present

## 2017-08-12 DIAGNOSIS — F419 Anxiety disorder, unspecified: Secondary | ICD-10-CM | POA: Diagnosis present

## 2017-08-12 DIAGNOSIS — I10 Essential (primary) hypertension: Secondary | ICD-10-CM | POA: Diagnosis not present

## 2017-08-12 DIAGNOSIS — Z981 Arthrodesis status: Secondary | ICD-10-CM

## 2017-08-12 DIAGNOSIS — Z01818 Encounter for other preprocedural examination: Secondary | ICD-10-CM | POA: Diagnosis not present

## 2017-08-12 DIAGNOSIS — K8020 Calculus of gallbladder without cholecystitis without obstruction: Secondary | ICD-10-CM | POA: Diagnosis not present

## 2017-08-12 DIAGNOSIS — C44209 Unspecified malignant neoplasm of skin of left ear and external auricular canal: Secondary | ICD-10-CM | POA: Diagnosis present

## 2017-08-12 DIAGNOSIS — Z7982 Long term (current) use of aspirin: Secondary | ICD-10-CM

## 2017-08-12 DIAGNOSIS — I129 Hypertensive chronic kidney disease with stage 1 through stage 4 chronic kidney disease, or unspecified chronic kidney disease: Secondary | ICD-10-CM | POA: Diagnosis present

## 2017-08-12 DIAGNOSIS — I252 Old myocardial infarction: Secondary | ICD-10-CM

## 2017-08-12 DIAGNOSIS — E039 Hypothyroidism, unspecified: Secondary | ICD-10-CM | POA: Diagnosis present

## 2017-08-12 DIAGNOSIS — D649 Anemia, unspecified: Secondary | ICD-10-CM | POA: Diagnosis present

## 2017-08-12 DIAGNOSIS — I739 Peripheral vascular disease, unspecified: Secondary | ICD-10-CM | POA: Diagnosis present

## 2017-08-12 DIAGNOSIS — D696 Thrombocytopenia, unspecified: Secondary | ICD-10-CM | POA: Diagnosis not present

## 2017-08-12 DIAGNOSIS — N183 Chronic kidney disease, stage 3 (moderate): Secondary | ICD-10-CM | POA: Diagnosis present

## 2017-08-12 DIAGNOSIS — R1110 Vomiting, unspecified: Secondary | ICD-10-CM | POA: Diagnosis not present

## 2017-08-12 LAB — URINALYSIS, ROUTINE W REFLEX MICROSCOPIC
BILIRUBIN URINE: NEGATIVE
Bacteria, UA: NONE SEEN
Glucose, UA: NEGATIVE mg/dL
Ketones, ur: NEGATIVE mg/dL
LEUKOCYTES UA: NEGATIVE
Nitrite: NEGATIVE
PH: 5 (ref 5.0–8.0)
Protein, ur: NEGATIVE mg/dL
SPECIFIC GRAVITY, URINE: 1.02 (ref 1.005–1.030)
Squamous Epithelial / LPF: NONE SEEN

## 2017-08-12 LAB — COMPREHENSIVE METABOLIC PANEL
ALT: 13 U/L — ABNORMAL LOW (ref 17–63)
AST: 28 U/L (ref 15–41)
Albumin: 4.2 g/dL (ref 3.5–5.0)
Alkaline Phosphatase: 71 U/L (ref 38–126)
Anion gap: 11 (ref 5–15)
BUN: 23 mg/dL — ABNORMAL HIGH (ref 6–20)
CO2: 24 mmol/L (ref 22–32)
Calcium: 9.3 mg/dL (ref 8.9–10.3)
Chloride: 102 mmol/L (ref 101–111)
Creatinine, Ser: 1.36 mg/dL — ABNORMAL HIGH (ref 0.61–1.24)
GFR calc Af Amer: 51 mL/min — ABNORMAL LOW (ref >60)
GFR calc non Af Amer: 44 mL/min — ABNORMAL LOW (ref >60)
Glucose, Bld: 153 mg/dL — ABNORMAL HIGH (ref 65–99)
Potassium: 3.7 mmol/L (ref 3.5–5.1)
Sodium: 137 mmol/L (ref 135–145)
Total Bilirubin: 0.4 mg/dL (ref 0.3–1.2)
Total Protein: 7.2 g/dL (ref 6.5–8.1)

## 2017-08-12 LAB — LIPASE, BLOOD: Lipase: 38 U/L (ref 11–51)

## 2017-08-12 LAB — CBC
HCT: 33.5 % — ABNORMAL LOW (ref 39.0–52.0)
Hemoglobin: 11.1 g/dL — ABNORMAL LOW (ref 13.0–17.0)
MCH: 31.9 pg (ref 26.0–34.0)
MCHC: 33.1 g/dL (ref 30.0–36.0)
MCV: 96.3 fL (ref 78.0–100.0)
Platelets: 157 10*3/uL (ref 150–400)
RBC: 3.48 MIL/uL — ABNORMAL LOW (ref 4.22–5.81)
RDW: 12.8 % (ref 11.5–15.5)
WBC: 5.7 10*3/uL (ref 4.0–10.5)

## 2017-08-12 MED ORDER — CEFTRIAXONE SODIUM 1 G IJ SOLR
1.0000 g | Freq: Once | INTRAMUSCULAR | Status: AC
  Administered 2017-08-12: 1 g via INTRAVENOUS
  Filled 2017-08-12: qty 10

## 2017-08-12 MED ORDER — MORPHINE SULFATE (PF) 4 MG/ML IV SOLN
1.0000 mg | Freq: Once | INTRAVENOUS | Status: AC
Start: 2017-08-13 — End: 2017-08-13
  Administered 2017-08-13: 1 mg via INTRAVENOUS
  Filled 2017-08-12: qty 1

## 2017-08-12 NOTE — ED Notes (Signed)
ED Provider at bedside. 

## 2017-08-12 NOTE — ED Provider Notes (Addendum)
[facility] EMERGENCY DEPARTMENT Provider Note   CSN: [id number] Arrival date & time: 08/12/17  1826     History   Chief Complaint Chief Complaint  Patient presents with  . Abdominal Pain    HPI George Reid is a 84 y.o. male.  HPI 84 year old man presents today complaining of some right lower sided pain.  He states that it began earlier this week but he is unclear exactly when it started.  It did worsen today.  He points to the right inguinal area.  He denies having similar symptoms in the past.  He has had one episode of vomiting today.  There was no blood or bile present.  He denies seeing blood in his urine or having UTI symptoms. Past Medical History:  Diagnosis Date  . Arthritis   . Depression   . GERD (gastroesophageal reflux disease)   . Hyperthyroidism   . Myocardial infarction ([place]) 1992    There are no active problems to display for this patient.   Past Surgical History:  Procedure Laterality Date  . ESOPHAGEAL DILATION     x2- years ago- refused to do it again- states that Prilosec helps with the choaking.  Marland Kitchen EYE SURGERY     cataract bil  . INGUINAL HERNIA REPAIR     Right  . LUMBAR LAMINECTOMY/DECOMPRESSION MICRODISCECTOMY  05/18/2012   Procedure: LUMBAR LAMINECTOMY/DECOMPRESSION MICRODISCECTOMY 1 LEVEL;  Surgeon: Winfield Cunas, MD;  Location: [place] [facility];  Service: Neurosurgery;  Laterality: Right;  RIGHT Lumbar four-five diskectomy       Home Medications    Prior to Admission medications   Medication Sig Start Date End Date Taking? Authorizing Provider  aspirin 81 MG tablet Take 81 mg by mouth daily.    [provider]  cyclobenzaprine (FLEXERIL) 10 MG tablet Take 1 tablet (10 mg total) by mouth 3 (three) times daily as needed for muscle spasms. 05/18/12   Ashok Pall, MD  HYDROcodone-acetaminophen (NORCO/VICODIN) 5-325 MG per tablet Take 1 tablet by mouth every 6 (six) hours as needed. For pain 01/10/15   Billy Fischer, MD  ibuprofen (ADVIL,MOTRIN) 200 MG tablet Take 200 mg by mouth every 6 (six) hours as needed. For pain.    [provider]  levothyroxine (SYNTHROID, LEVOTHROID) 75 MCG tablet Take 75 mcg by mouth daily.    [provider]  omeprazole (PRILOSEC) 20 MG capsule Take 20 mg by mouth daily.    [provider]  simvastatin (ZOCOR) 80 MG tablet Take 40 mg by mouth at bedtime.    [provider]  verapamil (CALAN-SR) 240 MG CR tablet Take 240 mg by mouth at bedtime.    [provider]    Family History History reviewed. No pertinent family history.  Social History Social History   Tobacco Use  . Smoking status: Never Smoker  . Smokeless tobacco: Never Used  Substance Use Topics  . Alcohol use: No  . Drug use: No     Allergies   Patient has no known allergies.   Review of Systems Review of Systems  All other systems reviewed and are negative.    Physical Exam Updated Vital Signs BP (!) 169/76   Pulse 84   Temp 98.8 F (37.1 C) (Oral)   Resp 20   SpO2 100%   Physical Exam  Constitutional: He is oriented to person, place, and time. He appears well-developed and well-nourished.  HENT:  Head: Normocephalic and atraumatic.  Mouth/Throat: Oropharynx  is clear and moist.  Cardiovascular: Normal rate and regular rhythm.  Pulmonary/Chest: Effort normal and breath sounds normal.  Abdominal: Soft. Normal appearance, normal aorta and bowel sounds are normal.  No tenderness to palpation No pulsatile masses No rebound tenderness  Neurological: He is alert and oriented to person, place, and time.  Skin: Skin is warm and dry. Capillary refill takes less than 2 seconds.  Nursing note and vitals reviewed.    ED Treatments / Results  Labs (all labs ordered are listed, but only abnormal results are displayed) Labs Reviewed  COMPREHENSIVE METABOLIC PANEL - Abnormal; Notable for the following components:      Result Value   Glucose,  Bld 153 (*)    BUN 23 (*)    Creatinine, Ser 1.36 (*)    ALT 13 (*)    GFR calc non Af Amer 44 (*)    GFR calc Af Amer 51 (*)    All other components within normal limits  CBC - Abnormal; Notable for the following components:   RBC 3.48 (*)    Hemoglobin 11.1 (*)    HCT 33.5 (*)    All other components within normal limits  URINALYSIS, ROUTINE W REFLEX MICROSCOPIC - Abnormal; Notable for the following components:   Hgb urine dipstick MODERATE (*)    All other components within normal limits  LIPASE, BLOOD    EKG  EKG Interpretation  Date/Time:  Sunday August 13 2017 00:07:25 EST Ventricular Rate:  81 PR Interval:    QRS Duration: 98 QT Interval:  400 QTC Calculation: 465 R Axis:   71 Text Interpretation:  Sinus rhythm Atrial premature complexes in couplets Abnormal R-wave progression, early transition Inferior infarct, old Baseline wander in lead(s) V2 V4 V5 Confirmed by Pattricia Boss [phone]) on 08/13/2017 12:10:34 AM       Radiology Ct Renal Stone Study  Result Date: 08/12/2017 CLINICAL DATA:  84 year old with flank pain. EXAM: CT ABDOMEN AND PELVIS WITHOUT CONTRAST TECHNIQUE: Multidetector CT imaging of the abdomen and pelvis was performed following the standard protocol without IV contrast. COMPARISON:  None. FINDINGS: Lower chest: Chronic changes with scattered pulmonary cysts. Subpleural reticulations suggest fibrosis. No pleural fluid. There is moderate hiatal hernia. Hepatobiliary: Calcified gallstones within physiologically distended gallbladder. No pericholecystic inflammation. No evidence of focal hepatic lesion allowing for lack contrast. Pancreas: No ductal dilatation or inflammation. Spleen: Normal in size without focal abnormality. Adrenals/Urinary Tract: No adrenal nodule. Ovoid 10 x 14 mm stone in the right ureteropelvic junction with moderate right hydroureteronephrosis. Mild right perinephric edema. Right ureter distally is decompressed. Probable extrarenal pelvis  configuration of the left kidney without left hydronephrosis. No left urolithiasis. Urinary bladder is partially distended. Questionable wall thickening of the bladder dome. No bladder stones. Stomach/Bowel: Colonic diverticulosis, prominent in the distal descending and sigmoid colon. No diverticulitis. Normal appendix. No bowel inflammation, wall thickening or obstruction. Moderate hiatal hernia. Stomach is otherwise nondistended. Vascular/Lymphatic: Dense aortic and branch atherosclerosis. Mild ectasia of the aorta measuring 2.6 cm. Small mesenteric nodes in the left abdomen with minimal mesenteric edema, likely reactive. No enlarged abdominal or pelvic lymph nodes. Reproductive: Prominent prostate gland spanning 4.6 cm. Other: No ascites or free air.  Intra-abdominal abscess. Musculoskeletal: T12 compression fracture is chronic, however has likely progressed from July 2016 lumbar spine radiograph. Multilevel degenerative change in the lumbar spine. The bones are under mineralized. IMPRESSION: 1. Obstructing 10 x 14 mm stone at the right ureteropelvic junction with moderate hydronephrosis. 2. Incidental findings of gallstones,  moderate hiatal hernia, and colonic diverticulosis. 3. Chronic T12 compression fracture, with mild progressive loss of height from July 2016 lumbar spine radiograph. 4. Aortic Atherosclerosis (ICD10-I70.0). Aortic ectasia maximal dimension 2.6 cm. Ectatic abdominal aorta at risk for aneurysm development. Recommend followup by ultrasound in 5 years (giving consideration for patient's advanced age. This recommendation follows ACR consensus guidelines: White Paper of the ACR Incidental Findings Committee II on Vascular Findings. J Am Coll Radiol 2013; 10:789-794. 5. Subpleural reticular changes in the lung bases suggest fibrosis. Electronically Signed   By: Jeb Levering M.D.   On: 08/12/2017 23:12    Procedures Procedures (including critical care time)  Medications Ordered in  ED Medications - No data to display   Initial Impression / Assessment and Plan / ED Course  I have reviewed the triage vital signs and the nursing notes.  Pertinent labs & imaging results that were available during my care of the patient were reviewed by me and considered in my medical decision making (see chart for details).     84 year old man with right flank pain.  CT here reveals 10 x 14 mm stone at the right UPJ with moderate had no hydronephrosis.  Patient has had urine cultured.  Rocephin is ordered IV.  Urology is being consulted Large right kidney stone Ureteral obstruction aki Volume depletion hypertenison with known hypertension- likely exacerbated by pain  Discussed with Dr. Jess Barters- given patient's advanced age plan admission for pain control and urology will see for stenting further urologic care. Test with Dr. Myna Hidalgo and he will arrange for patient to be admitted twice in all Final Clinical Impressions(s) / ED Diagnoses   Final diagnoses:  Obstruction of right ureteropelvic junction (UPJ) due to stone    ED Discharge Orders    None       Pattricia Boss, MD 08/13/17 Roderic Palau    Pattricia Boss, MD 08/13/17 0010

## 2017-08-12 NOTE — ED Notes (Signed)
Patient transported to CT 

## 2017-08-12 NOTE — ED Triage Notes (Signed)
Patient presents to ED for assessment of RLQ pain starting a few hours ago, sharp in nature, with one episode of vomiting after eating.  Patient c/o some intermittent issues with constipation recently.  Denies diarrhea.  Patient states he has been having issues with urinary frequency.

## 2017-08-13 ENCOUNTER — Inpatient Hospital Stay (HOSPITAL_COMMUNITY): Payer: PPO

## 2017-08-13 ENCOUNTER — Other Ambulatory Visit: Payer: Self-pay

## 2017-08-13 ENCOUNTER — Encounter (HOSPITAL_COMMUNITY): Payer: Self-pay | Admitting: Family Medicine

## 2017-08-13 DIAGNOSIS — I77811 Abdominal aortic ectasia: Secondary | ICD-10-CM | POA: Diagnosis present

## 2017-08-13 DIAGNOSIS — I252 Old myocardial infarction: Secondary | ICD-10-CM | POA: Diagnosis not present

## 2017-08-13 DIAGNOSIS — N289 Disorder of kidney and ureter, unspecified: Secondary | ICD-10-CM | POA: Diagnosis present

## 2017-08-13 DIAGNOSIS — N201 Calculus of ureter: Secondary | ICD-10-CM | POA: Diagnosis present

## 2017-08-13 DIAGNOSIS — Z681 Body mass index (BMI) 19 or less, adult: Secondary | ICD-10-CM | POA: Diagnosis not present

## 2017-08-13 DIAGNOSIS — K219 Gastro-esophageal reflux disease without esophagitis: Secondary | ICD-10-CM | POA: Diagnosis present

## 2017-08-13 DIAGNOSIS — E039 Hypothyroidism, unspecified: Secondary | ICD-10-CM | POA: Diagnosis present

## 2017-08-13 DIAGNOSIS — I10 Essential (primary) hypertension: Secondary | ICD-10-CM | POA: Diagnosis not present

## 2017-08-13 DIAGNOSIS — N132 Hydronephrosis with renal and ureteral calculous obstruction: Secondary | ICD-10-CM | POA: Diagnosis present

## 2017-08-13 DIAGNOSIS — I4891 Unspecified atrial fibrillation: Secondary | ICD-10-CM | POA: Diagnosis not present

## 2017-08-13 DIAGNOSIS — I739 Peripheral vascular disease, unspecified: Secondary | ICD-10-CM | POA: Diagnosis present

## 2017-08-13 DIAGNOSIS — Z981 Arthrodesis status: Secondary | ICD-10-CM | POA: Diagnosis not present

## 2017-08-13 DIAGNOSIS — F419 Anxiety disorder, unspecified: Secondary | ICD-10-CM | POA: Diagnosis present

## 2017-08-13 DIAGNOSIS — E440 Moderate protein-calorie malnutrition: Secondary | ICD-10-CM | POA: Diagnosis present

## 2017-08-13 DIAGNOSIS — Y838 Other surgical procedures as the cause of abnormal reaction of the patient, or of later complication, without mention of misadventure at the time of the procedure: Secondary | ICD-10-CM | POA: Diagnosis not present

## 2017-08-13 DIAGNOSIS — F329 Major depressive disorder, single episode, unspecified: Secondary | ICD-10-CM | POA: Diagnosis present

## 2017-08-13 DIAGNOSIS — C44209 Unspecified malignant neoplasm of skin of left ear and external auricular canal: Secondary | ICD-10-CM | POA: Diagnosis present

## 2017-08-13 DIAGNOSIS — I97191 Other postprocedural cardiac functional disturbances following other surgery: Secondary | ICD-10-CM | POA: Diagnosis not present

## 2017-08-13 DIAGNOSIS — I2510 Atherosclerotic heart disease of native coronary artery without angina pectoris: Secondary | ICD-10-CM | POA: Diagnosis present

## 2017-08-13 DIAGNOSIS — Z79899 Other long term (current) drug therapy: Secondary | ICD-10-CM | POA: Diagnosis not present

## 2017-08-13 DIAGNOSIS — N183 Chronic kidney disease, stage 3 (moderate): Secondary | ICD-10-CM | POA: Diagnosis present

## 2017-08-13 DIAGNOSIS — D649 Anemia, unspecified: Secondary | ICD-10-CM | POA: Diagnosis present

## 2017-08-13 DIAGNOSIS — D696 Thrombocytopenia, unspecified: Secondary | ICD-10-CM | POA: Diagnosis not present

## 2017-08-13 DIAGNOSIS — E0590 Thyrotoxicosis, unspecified without thyrotoxic crisis or storm: Secondary | ICD-10-CM | POA: Insufficient documentation

## 2017-08-13 DIAGNOSIS — I129 Hypertensive chronic kidney disease with stage 1 through stage 4 chronic kidney disease, or unspecified chronic kidney disease: Secondary | ICD-10-CM | POA: Diagnosis present

## 2017-08-13 DIAGNOSIS — Z79890 Hormone replacement therapy (postmenopausal): Secondary | ICD-10-CM | POA: Diagnosis not present

## 2017-08-13 DIAGNOSIS — Z7982 Long term (current) use of aspirin: Secondary | ICD-10-CM | POA: Diagnosis not present

## 2017-08-13 LAB — COMPREHENSIVE METABOLIC PANEL
ALBUMIN: 3.7 g/dL (ref 3.5–5.0)
ALT: 12 U/L — ABNORMAL LOW (ref 17–63)
ANION GAP: 10 (ref 5–15)
AST: 24 U/L (ref 15–41)
Alkaline Phosphatase: 65 U/L (ref 38–126)
BUN: 25 mg/dL — ABNORMAL HIGH (ref 6–20)
CALCIUM: 8.8 mg/dL — AB (ref 8.9–10.3)
CHLORIDE: 105 mmol/L (ref 101–111)
CO2: 24 mmol/L (ref 22–32)
Creatinine, Ser: 1.25 mg/dL — ABNORMAL HIGH (ref 0.61–1.24)
GFR calc non Af Amer: 49 mL/min — ABNORMAL LOW (ref >60)
GFR, EST AFRICAN AMERICAN: 57 mL/min — AB (ref >60)
Glucose, Bld: 118 mg/dL — ABNORMAL HIGH (ref 65–99)
POTASSIUM: 4.3 mmol/L (ref 3.5–5.1)
SODIUM: 139 mmol/L (ref 135–145)
Total Bilirubin: 0.4 mg/dL (ref 0.3–1.2)
Total Protein: 6.4 g/dL — ABNORMAL LOW (ref 6.5–8.1)

## 2017-08-13 LAB — CBC WITH DIFFERENTIAL/PLATELET
BASOS PCT: 0 %
Basophils Absolute: 0 10*3/uL (ref 0.0–0.1)
EOS ABS: 0 10*3/uL (ref 0.0–0.7)
EOS PCT: 0 %
HCT: 32.1 % — ABNORMAL LOW (ref 39.0–52.0)
Hemoglobin: 10.9 g/dL — ABNORMAL LOW (ref 13.0–17.0)
LYMPHS ABS: 1.3 10*3/uL (ref 0.7–4.0)
Lymphocytes Relative: 20 %
MCH: 32.2 pg (ref 26.0–34.0)
MCHC: 34 g/dL (ref 30.0–36.0)
MCV: 94.7 fL (ref 78.0–100.0)
Monocytes Absolute: 0.5 10*3/uL (ref 0.1–1.0)
Monocytes Relative: 7 %
Neutro Abs: 4.7 10*3/uL (ref 1.7–7.7)
Neutrophils Relative %: 73 %
PLATELETS: 147 10*3/uL — AB (ref 150–400)
RBC: 3.39 MIL/uL — ABNORMAL LOW (ref 4.22–5.81)
RDW: 12.7 % (ref 11.5–15.5)
WBC: 6.4 10*3/uL (ref 4.0–10.5)

## 2017-08-13 LAB — PHOSPHORUS: PHOSPHORUS: 3 mg/dL (ref 2.5–4.6)

## 2017-08-13 LAB — GLUCOSE, CAPILLARY: Glucose-Capillary: 108 mg/dL — ABNORMAL HIGH (ref 65–99)

## 2017-08-13 LAB — MAGNESIUM: Magnesium: 1.6 mg/dL — ABNORMAL LOW (ref 1.7–2.4)

## 2017-08-13 MED ORDER — LEVOTHYROXINE SODIUM 75 MCG PO TABS
75.0000 ug | ORAL_TABLET | Freq: Every day | ORAL | Status: DC
  Administered 2017-08-13 – 2017-08-15 (×3): 75 ug via ORAL
  Filled 2017-08-13 (×3): qty 1

## 2017-08-13 MED ORDER — POTASSIUM CHLORIDE IN NACL 20-0.9 MEQ/L-% IV SOLN
INTRAVENOUS | Status: AC
  Administered 2017-08-14 (×2): via INTRAVENOUS
  Filled 2017-08-13: qty 1000

## 2017-08-13 MED ORDER — POTASSIUM CHLORIDE IN NACL 20-0.9 MEQ/L-% IV SOLN
INTRAVENOUS | Status: AC
  Administered 2017-08-13 (×2): via INTRAVENOUS
  Filled 2017-08-13 (×2): qty 1000

## 2017-08-13 MED ORDER — ACETAMINOPHEN 650 MG RE SUPP: 650.0000 mg | Freq: Four times a day (QID) | RECTAL | Status: DC | PRN

## 2017-08-13 MED ORDER — SENNOSIDES-DOCUSATE SODIUM 8.6-50 MG PO TABS: 1.0000 | ORAL_TABLET | Freq: Every evening | ORAL | Status: DC | PRN

## 2017-08-13 MED ORDER — ATORVASTATIN CALCIUM 40 MG PO TABS
40.0000 mg | ORAL_TABLET | Freq: Every day | ORAL | Status: DC
  Administered 2017-08-13 – 2017-08-14 (×2): 40 mg via ORAL
  Filled 2017-08-13 (×2): qty 1

## 2017-08-13 MED ORDER — MORPHINE SULFATE (PF) 4 MG/ML IV SOLN
1.0000 mg | INTRAVENOUS | Status: DC | PRN
  Administered 2017-08-13: 1 mg via INTRAVENOUS
  Filled 2017-08-13: qty 1

## 2017-08-13 MED ORDER — CYCLOBENZAPRINE HCL 10 MG PO TABS: 10.0000 mg | ORAL_TABLET | Freq: Three times a day (TID) | ORAL | Status: DC | PRN

## 2017-08-13 MED ORDER — SODIUM CHLORIDE 0.9 % IV SOLN
1.0000 g | Freq: Once | INTRAVENOUS | Status: AC
  Administered 2017-08-13: 1 g via INTRAVENOUS
  Filled 2017-08-13: qty 1

## 2017-08-13 MED ORDER — HYDRALAZINE HCL 20 MG/ML IJ SOLN: 10.0000 mg | INTRAMUSCULAR | Status: DC | PRN

## 2017-08-13 MED ORDER — ONDANSETRON HCL 4 MG PO TABS: 4.0000 mg | ORAL_TABLET | Freq: Four times a day (QID) | ORAL | Status: DC | PRN

## 2017-08-13 MED ORDER — BISACODYL 5 MG PO TBEC: 5.0000 mg | DELAYED_RELEASE_TABLET | Freq: Every day | ORAL | Status: DC | PRN

## 2017-08-13 MED ORDER — VERAPAMIL HCL ER 240 MG PO TBCR
240.0000 mg | EXTENDED_RELEASE_TABLET | Freq: Every day | ORAL | Status: DC
  Administered 2017-08-13 – 2017-08-14 (×3): 240 mg via ORAL
  Filled 2017-08-13 (×3): qty 1

## 2017-08-13 MED ORDER — PANTOPRAZOLE SODIUM 40 MG PO TBEC
40.0000 mg | DELAYED_RELEASE_TABLET | Freq: Every day | ORAL | Status: DC
  Administered 2017-08-13 – 2017-08-15 (×3): 40 mg via ORAL
  Filled 2017-08-13 (×3): qty 1

## 2017-08-13 MED ORDER — ACETAMINOPHEN 325 MG PO TABS: 650.0000 mg | ORAL_TABLET | Freq: Four times a day (QID) | ORAL | Status: DC | PRN

## 2017-08-13 MED ORDER — ONDANSETRON HCL 4 MG/2ML IJ SOLN: 4.0000 mg | Freq: Four times a day (QID) | INTRAMUSCULAR | Status: DC | PRN

## 2017-08-13 NOTE — Progress Notes (Signed)
The patient was admitted early this AM after midnight and H and P has been reviewed and I am in current agreement with the Assessment and Plan done by Dr. Mitzi Hansen. Additional changes to the plan of care have been made accordingly. The patient is a 84 year old Male with a PMH of HTN, Hypothyroidism, GERD, Anxiety and Depression, Hx of MI, Lumbar Laminectomy and Decompression, and other comorbids who presented to [facility] with a cc of Severe Right sided Flank Pain. He under went CT Renal Stone Study and was found to have an obstructing Right UPJ Stone with Hydronephrosis. He was started on Empirc Abx and transferred to [facility] for evaluation by Urology. We will continue IVF Hydration, Analgesia, and follow up with Cx's and fUrology evaluation for further recommendations. Urology recommending Cystourethroscopy and ureteroscopic stone extraction with laser lithotripsy and basket extraction tomorrow. Will continue to Monitor patient's clinical response to intervention and repeat Blood work today and tomorrow and anticipate D/C Home post-operatively.

## 2017-08-13 NOTE — H&P (Signed)
History and Physical    George Reid CBJ:[id number] DOB: 1925/02/09 DOA: 08/12/2017  PCP: Burnard Bunting, MD   Patient coming from: Home  Chief Complaint: severe pain in right flank and RLQ   HPI: George Reid is a 84 y.o. male with medical history significant for hypertension and hypothyroidism, now presenting to the emergency department for evaluation of acute onset severe pain in the right flank and right lower quadrant.  Patient reports that he had been in his usual state of health earlier today, but noted acute onset of severe pain in the right flank and right lower quadrant of his abdomen this evening.  He describes the pain as constant, sharp, associated with mild nausea and one episode of vomiting.  He has not experienced similar symptoms previously.  He denies any change in his bowel habits, denies fevers or chills, and denies dysuria or hematuria.  ED Course: Upon arrival to the ED, patient is found to be chemistry panel reveals a creatinine of 1.36, up from less than 1 afebrile, saturating well on room air, and with vitals otherwise normal.  In 2013 with no more recent values available.  CBC is notable for mild normocytic anemia and urinalysis is unremarkable.  CT stone study was obtained and reveals a 10 x 14 mm right UPJ stone with moderate hydronephrosis.  Abdominal aortic ectasia was noted incidentally on the CT with recommendation for abdominal ultrasound in 5 years.  Patient was treated with morphine and prophylactic Rocephin in the ED.  Urine was sent for culture.  Urology was consulted by the ED physician and recommended a medical admission to [facility] for pain control and likely intervention.  Patient remains hemodynamically stable, in no apparent respiratory distress, and will be admitted to the medical-surgical unit for ongoing evaluation and management of right UPJ stone with hydronephrosis.  Review of Systems:  All other systems reviewed and apart from HPI, are  negative.  Past Medical History:  Diagnosis Date  . Arthritis   . Depression   . GERD (gastroesophageal reflux disease)   . Hyperthyroidism   . Myocardial infarction ([place]) 1992    Past Surgical History:  Procedure Laterality Date  . ESOPHAGEAL DILATION     x2- years ago- refused to do it again- states that Prilosec helps with the choaking.  Marland Kitchen EYE SURGERY     cataract bil  . INGUINAL HERNIA REPAIR     Right  . LUMBAR LAMINECTOMY/DECOMPRESSION MICRODISCECTOMY  05/18/2012   Procedure: LUMBAR LAMINECTOMY/DECOMPRESSION MICRODISCECTOMY 1 LEVEL;  Surgeon: Winfield Cunas, MD;  Location: [place] [facility] OR[facility];  Service: Neurosurgery;  Laterality: Right;  RIGHT Lumbar four-five diskectomy     reports that  has never smoked. he has never used smokeless tobacco. He reports that he does not drink alcohol or use drugs.  No Known Allergies  History reviewed. No pertinent family history.   Prior to Admission medications   Medication Sig Start Date End Date Taking? Authorizing Provider  aspirin 81 MG tablet Take 81 mg by mouth daily.    [provider]  cyclobenzaprine (FLEXERIL) 10 MG tablet Take 1 tablet (10 mg total) by mouth 3 (three) times daily as needed for muscle spasms. 05/18/12   Ashok Pall, MD  HYDROcodone-acetaminophen (NORCO/VICODIN) 5-325 MG per tablet Take 1 tablet by mouth every 6 (six) hours as needed. For pain 01/10/15   Billy Fischer, MD  ibuprofen (ADVIL,MOTRIN) 200 MG tablet Take 200 mg by mouth every 6 (six) hours as needed.  For pain.    [provider]  levothyroxine (SYNTHROID, LEVOTHROID) 75 MCG tablet Take 75 mcg by mouth daily.    [provider]  omeprazole (PRILOSEC) 20 MG capsule Take 20 mg by mouth daily.    [provider]  simvastatin (ZOCOR) 80 MG tablet Take 40 mg by mouth at bedtime.    [provider]  verapamil (CALAN-SR) 240 MG CR tablet Take 240 mg by mouth at bedtime.    [provider]    Physical  Exam: Vitals:   08/12/17 2245 08/12/17 2300 08/12/17 2315 08/13/17 0000  BP: (!) 170/74 (!) 173/71 (!) 174/81 (!) 163/72  Pulse: 81 81 81 76  Resp: 19 20 (!) 24 13  Temp:      TempSrc:      SpO2: 97% 95% 97% 97%      Constitutional: NAD, calm, in apparent discomfort Eyes: PERTLA, lids and conjunctivae normal ENMT: Mucous membranes are moist. Posterior pharynx clear of any exudate or lesions.   Neck: normal, supple, no masses, no thyromegaly Respiratory: clear to auscultation bilaterally, no wheezing, no crackles. Normal respiratory effort.  Cardiovascular: S1 & S2 heard, regular rate and rhythm. No significant JVD. Abdomen: No distension, tender in right flank and RLQ without rebound pain or guarding. Bowel sounds active.  Musculoskeletal: no clubbing / cyanosis. No joint deformity upper and lower extremities.   Skin: no significant rashes, lesions, ulcers. Warm, dry, well-perfused. Neurologic: CN 2-12 grossly intact. Sensation intact. Strength 5/5 in all 4 limbs.  Psychiatric: Alert and oriented x 3. Pleasant, cooperative.     Labs on Admission: I have personally reviewed following labs and imaging studies  CBC: Recent Labs  Lab 08/12/17 1839  WBC 5.7  HGB 11.1*  HCT 33.5*  MCV 96.3  PLT 263   Basic Metabolic Panel: Recent Labs  Lab 08/12/17 1839  NA 137  K 3.7  CL 102  CO2 24  GLUCOSE 153*  BUN 23*  CREATININE 1.36*  CALCIUM 9.3   GFR: CrCl cannot be calculated (Unknown ideal weight.). Liver Function Tests: Recent Labs  Lab 08/12/17 1839  AST 28  ALT 13*  ALKPHOS 71  BILITOT 0.4  PROT 7.2  ALBUMIN 4.2   Recent Labs  Lab 08/12/17 1839  LIPASE 38   No results for input(s): AMMONIA in the last 168 hours. Coagulation Profile: No results for input(s): INR, PROTIME in the last 168 hours. Cardiac Enzymes: No results for input(s): CKTOTAL, CKMB, CKMBINDEX, TROPONINI in the last 168 hours. BNP (last 3 results) No results for input(s): PROBNP in the  last 8760 hours. HbA1C: No results for input(s): HGBA1C in the last 72 hours. CBG: No results for input(s): GLUCAP in the last 168 hours. Lipid Profile: No results for input(s): CHOL, HDL, LDLCALC, TRIG, CHOLHDL, LDLDIRECT in the last 72 hours. Thyroid Function Tests: No results for input(s): TSH, T4TOTAL, FREET4, T3FREE, THYROIDAB in the last 72 hours. Anemia Panel: No results for input(s): VITAMINB12, FOLATE, FERRITIN, TIBC, IRON, RETICCTPCT in the last 72 hours. Urine analysis:    Component Value Date/Time   COLORURINE YELLOW 08/12/2017 1839   APPEARANCEUR CLEAR 08/12/2017 1839   LABSPEC 1.020 08/12/2017 1839   PHURINE 5.0 08/12/2017 1839   GLUCOSEU NEGATIVE 08/12/2017 1839   HGBUR MODERATE (A) 08/12/2017 1839   BILIRUBINUR NEGATIVE 08/12/2017 1839   KETONESUR NEGATIVE 08/12/2017 1839   PROTEINUR NEGATIVE 08/12/2017 1839   NITRITE NEGATIVE 08/12/2017 1839   LEUKOCYTESUR NEGATIVE 08/12/2017 1839   Sepsis Labs: @LABRCNTIP (procalcitonin:4,lacticidven:4) )No  results found for this or any previous visit (from the past 240 hour(s)).   Radiological Exams on Admission: Ct Renal Stone Study  Result Date: 08/12/2017 CLINICAL DATA:  84 year old with flank pain. EXAM: CT ABDOMEN AND PELVIS WITHOUT CONTRAST TECHNIQUE: Multidetector CT imaging of the abdomen and pelvis was performed following the standard protocol without IV contrast. COMPARISON:  None. FINDINGS: Lower chest: Chronic changes with scattered pulmonary cysts. Subpleural reticulations suggest fibrosis. No pleural fluid. There is moderate hiatal hernia. Hepatobiliary: Calcified gallstones within physiologically distended gallbladder. No pericholecystic inflammation. No evidence of focal hepatic lesion allowing for lack contrast. Pancreas: No ductal dilatation or inflammation. Spleen: Normal in size without focal abnormality. Adrenals/Urinary Tract: No adrenal nodule. Ovoid 10 x 14 mm stone in the right ureteropelvic junction with  moderate right hydroureteronephrosis. Mild right perinephric edema. Right ureter distally is decompressed. Probable extrarenal pelvis configuration of the left kidney without left hydronephrosis. No left urolithiasis. Urinary bladder is partially distended. Questionable wall thickening of the bladder dome. No bladder stones. Stomach/Bowel: Colonic diverticulosis, prominent in the distal descending and sigmoid colon. No diverticulitis. Normal appendix. No bowel inflammation, wall thickening or obstruction. Moderate hiatal hernia. Stomach is otherwise nondistended. Vascular/Lymphatic: Dense aortic and branch atherosclerosis. Mild ectasia of the aorta measuring 2.6 cm. Small mesenteric nodes in the left abdomen with minimal mesenteric edema, likely reactive. No enlarged abdominal or pelvic lymph nodes. Reproductive: Prominent prostate gland spanning 4.6 cm. Other: No ascites or free air.  Intra-abdominal abscess. Musculoskeletal: T12 compression fracture is chronic, however has likely progressed from July 2016 lumbar spine radiograph. Multilevel degenerative change in the lumbar spine. The bones are under mineralized. IMPRESSION: 1. Obstructing 10 x 14 mm stone at the right ureteropelvic junction with moderate hydronephrosis. 2. Incidental findings of gallstones, moderate hiatal hernia, and colonic diverticulosis. 3. Chronic T12 compression fracture, with mild progressive loss of height from July 2016 lumbar spine radiograph. 4. Aortic Atherosclerosis (ICD10-I70.0). Aortic ectasia maximal dimension 2.6 cm. Ectatic abdominal aorta at risk for aneurysm development. Recommend followup by ultrasound in 5 years (giving consideration for patient's advanced age. This recommendation follows ACR consensus guidelines: White Paper of the ACR Incidental Findings Committee II on Vascular Findings. J Am Coll Radiol 2013; 10:789-794. 5. Subpleural reticular changes in the lung bases suggest fibrosis. Electronically Signed   By:  Jeb Levering M.D.   On: 08/12/2017 23:12    EKG: Not performed.   Assessment/Plan   1. Right UPJ stone with hydronephrosis  - Presents with acute-onset of severe pain at right flank and lower quadrant  - No fever, leukocytosis, or urinary sxs  - CT reveals 10 x 14 mm right UPJ stone with associated hydronephrosis  - Treated in ED with analgesia and prophylactic Rocephin, urine sent for culture  - Urology is consulting and much appreciated, recommending medical admission [facility] for pain-control and likely intervention  - Continue IVF hydration, analgesia, infection surveillance    2. Mild renal insufficiency  - SCr is 1.36 on admission, up from <1 in 2013 with no more recent values  - Hydrate with NS, renally-dose medications, avoid nephrotoxins, repeat chem panel in am    3. Hypertension  - BP elevated in ED with pain likely contributing  - Continue prn analgesia, continue verapamil, use hydralazine IVP's prn    4. Hypothyroidism  - Continue Synthroid    5. Aortic ectasia  - Ectasia of abdominal aorta noted incidentally on CT with recommendation for abdominal US in 5 years    DVT prophylaxis:  SCD's  Code Status: Full  Family Communication: Daughter updated at bedside Disposition Plan: Admit to [facility] med-surg unit Consults called: Urology Admission status: Inpatient    Vianne Bulls, MD [facility] Pager [phone]  If 7PM-7AM, please contact night-coverage [email or web address] Password [facility]  08/13/2017, 12:25 AM

## 2017-08-13 NOTE — Consult Note (Addendum)
Urology Consult Note   Requesting Attending Physician:  Kerney Elbe, DO Service Providing Consult: Urology  Consulting Attending: Dr. Diona Fanti   Reason for Consult:  Right flank pain  HPI: George Reid is seen in consultation for reasons noted above at the request of Kerney Elbe, DO for evaluation of right flank pain.  This is a 84 y.o. male with a history of CAD w/ MI in 1984 (no stents or CABG, on ASA 81mg ), HTN, and GERD who is admitted for flank pain related to a 1cm right proximal ureteral stone with hydronephrosis.   Pain started about 1 week ago, and has worsened and included n/v yesterday. Came to ED for evaluation, and admitted to medicine team.   No fevers or chills. No prior stone history. No prior GU hx or surgery.   Has CAD and had an MI in 1984, but has not had stents or a CABG. Takes ASA 81 daily. No active or exertional chest pain.    Past Medical History: Past Medical History:  Diagnosis Date  . Arthritis   . Depression   . GERD (gastroesophageal reflux disease)   . Hyperthyroidism   . Myocardial infarction ([place]) 1992    Past Surgical History:  Past Surgical History:  Procedure Laterality Date  . ESOPHAGEAL DILATION     x2- years ago- refused to do it again- states that Prilosec helps with the choaking.  [facility]land Kitchen EYE SURGERY     cataract bil  . INGUINAL HERNIA REPAIR     Right  . LUMBAR LAMINECTOMY/DECOMPRESSION MICRODISCECTOMY  05/18/2012   Procedure: LUMBAR LAMINECTOMY/DECOMPRESSION MICRODISCECTOMY 1 LEVEL;  Surgeon: Winfield Cunas, MD;  Location: [place] [facility];  Service: Neurosurgery;  Laterality: Right;  RIGHT Lumbar four-five diskectomy    Medication: Current Facility-Administered Medications  Medication Dose Route Frequency Provider Last Rate Last Dose  . acetaminophen (TYLENOL) tablet 650 mg  650 mg Oral Q6H PRN Opyd, Ilene Qua, MD       Or  . acetaminophen (TYLENOL) suppository 650 mg  650 mg Rectal Q6H PRN Opyd, Ilene Qua, MD      .  atorvastatin (LIPITOR) tablet 40 mg  40 mg Oral q1800 Opyd, Ilene Qua, MD      . bisacodyl (DULCOLAX) EC tablet 5 mg  5 mg Oral Daily PRN Opyd, Ilene Qua, MD      . cyclobenzaprine (FLEXERIL) tablet 10 mg  10 mg Oral TID PRN Opyd, Ilene Qua, MD      . hydrALAZINE (APRESOLINE) injection 10 mg  10 mg Intravenous Q4H PRN Opyd, Ilene Qua, MD      . levothyroxine (SYNTHROID, LEVOTHROID) tablet 75 mcg  75 mcg Oral QAC breakfast Opyd, Ilene Qua, MD   75 mcg at 08/13/17 1010  . morphine 4 MG/ML injection 1-3 mg  1-3 mg Intravenous Q4H PRN Opyd, Ilene Qua, MD   1 mg at 08/13/17 1010  . ondansetron (ZOFRAN) tablet 4 mg  4 mg Oral Q6H PRN Opyd, Ilene Qua, MD       Or  . ondansetron (ZOFRAN) injection 4 mg  4 mg Intravenous Q6H PRN Opyd, Ilene Qua, MD      . pantoprazole (PROTONIX) EC tablet 40 mg  40 mg Oral Daily Opyd, Ilene Qua, MD   40 mg at 08/13/17 1010  . senna-docusate (Senokot-S) tablet 1 tablet  1 tablet Oral QHS PRN Opyd, Ilene Qua, MD      . verapamil (CALAN-SR) CR tablet 240 mg  240 mg Oral QHS  Vianne Bulls, MD   240 mg at 08/13/17 9562    Allergies: No Known Allergies  Social History: Social History   Tobacco Use  . Smoking status: Never Smoker  . Smokeless tobacco: Never Used  Substance Use Topics  . Alcohol use: No  . Drug use: No    Family History History reviewed. No pertinent family history.  Review of Systems 10 systems were reviewed and are negative except as noted specifically in the HPI.  Objective   Vital signs in last 24 hours: BP (!) 168/71 (BP Location: Right Arm)   Pulse 71   Temp 99.6 F (37.6 C) (Oral)   Resp 18   Ht 6' (1.829 m)   Wt 58.1 kg (128 lb 1.4 oz)   SpO2 99%   BMI 17.37 kg/m   Physical Exam General: NAD, A&O, resting, appropriate HEENT: [place]/AT, EOMI, MMM Pulmonary: Normal work of breathing Cardiovascular: HDS, adequate peripheral perfusion Abdomen: Soft, NTTP, nondistended. GU: No CVA tenderness Extremities: warm and well  perfused Neuro: Appropriate, no focal neurological deficits  Most Recent Labs: Lab Results  Component Value Date   WBC 6.4 08/13/2017   HGB 10.9 (L) 08/13/2017   HCT 32.1 (L) 08/13/2017   PLT 147 (L) 08/13/2017    Lab Results  Component Value Date   NA 139 08/13/2017   K 4.3 08/13/2017   CL 105 08/13/2017   CO2 24 08/13/2017   BUN 25 (H) 08/13/2017   CREATININE 1.25 (H) 08/13/2017   CALCIUM 8.8 (L) 08/13/2017   MG 1.6 (L) 08/13/2017   PHOS 3.0 08/13/2017    Lab Results  Component Value Date   INR 1.0 05/01/2008     IMAGING: Ct Renal Stone Study  Result Date: 08/12/2017 CLINICAL DATA:  84 year old with flank pain. EXAM: CT ABDOMEN AND PELVIS WITHOUT CONTRAST TECHNIQUE: Multidetector CT imaging of the abdomen and pelvis was performed following the standard protocol without IV contrast. COMPARISON:  None. FINDINGS: Lower chest: Chronic changes with scattered pulmonary cysts. Subpleural reticulations suggest fibrosis. No pleural fluid. There is moderate hiatal hernia. Hepatobiliary: Calcified gallstones within physiologically distended gallbladder. No pericholecystic inflammation. No evidence of focal hepatic lesion allowing for lack contrast. Pancreas: No ductal dilatation or inflammation. Spleen: Normal in size without focal abnormality. Adrenals/Urinary Tract: No adrenal nodule. Ovoid 10 x 14 mm stone in the right ureteropelvic junction with moderate right hydroureteronephrosis. Mild right perinephric edema. Right ureter distally is decompressed. Probable extrarenal pelvis configuration of the left kidney without left hydronephrosis. No left urolithiasis. Urinary bladder is partially distended. Questionable wall thickening of the bladder dome. No bladder stones. Stomach/Bowel: Colonic diverticulosis, prominent in the distal descending and sigmoid colon. No diverticulitis. Normal appendix. No bowel inflammation, wall thickening or obstruction. Moderate hiatal hernia. Stomach is  otherwise nondistended. Vascular/Lymphatic: Dense aortic and branch atherosclerosis. Mild ectasia of the aorta measuring 2.6 cm. Small mesenteric nodes in the left abdomen with minimal mesenteric edema, likely reactive. No enlarged abdominal or pelvic lymph nodes. Reproductive: Prominent prostate gland spanning 4.6 cm. Other: No ascites or free air.  Intra-abdominal abscess. Musculoskeletal: T12 compression fracture is chronic, however has likely progressed from July 2016 lumbar spine radiograph. Multilevel degenerative change in the lumbar spine. The bones are under mineralized. IMPRESSION: 1. Obstructing 10 x 14 mm stone at the right ureteropelvic junction with moderate hydronephrosis. 2. Incidental findings of gallstones, moderate hiatal hernia, and colonic diverticulosis. 3. Chronic T12 compression fracture, with mild progressive loss of height from July 2016 lumbar spine radiograph. 4.  Aortic Atherosclerosis (ICD10-I70.0). Aortic ectasia maximal dimension 2.6 cm. Ectatic abdominal aorta at risk for aneurysm development. Recommend followup by ultrasound in 5 years (giving consideration for patient's advanced age. This recommendation follows ACR consensus guidelines: White Paper of the ACR Incidental Findings Committee II on Vascular Findings. J Am Coll Radiol 2013; 10:789-794. 5. Subpleural reticular changes in the lung bases suggest fibrosis. Electronically Signed   By: Jeb Levering M.D.   On: 08/12/2017 23:12    ------  Assessment:  84 y.o. male with CAD on ASA 81mg , HTN, GERD, admitted for a symptomatic 1cm obstructing right proximal ureteral stone with proximal hydronephrosis. Complaining of pain, but otherwise doing well. Vitals normal. Cr 1.3 from normal baseline, but no evidence of infection. Labs otherwise WNL. Covered with empiric CTX while Ucx in process.   Discussed etiology, natural hx, and treatment options for his 1cm R UPJ stone, including ESWL, ureteroscopic stone extraction (USE),  and PCNL. Stone unlikely to pass on its own, and I would recommend USE. Risks and benefits discussed in detail. He understands and is eager to proceed.    Recommendations: - NPO at midnight for cystourethroscopy, ureteroscopic stone extraction with laser lithotripsy, and basket extraction.  - Should be able to be discharged post-op.    Thank you for this consult. Please contact the urology consult pager with any further questions/concerns.  I have interviewed the patient and his daughter Jana Half. I have reviewed the pt's history and images. In agree with the above assessment and plan.

## 2017-08-14 ENCOUNTER — Encounter (HOSPITAL_COMMUNITY)
Admission: EM | Disposition: A | Discharge status: Home / Self Care | Payer: Self-pay | Source: Home / Self Care | Attending: Internal Medicine

## 2017-08-14 ENCOUNTER — Inpatient Hospital Stay (HOSPITAL_COMMUNITY): Payer: PPO | Admitting: Anesthesiology

## 2017-08-14 ENCOUNTER — Encounter (HOSPITAL_COMMUNITY): Payer: Self-pay | Admitting: Urology

## 2017-08-14 ENCOUNTER — Inpatient Hospital Stay (HOSPITAL_COMMUNITY): Payer: PPO

## 2017-08-14 DIAGNOSIS — D649 Anemia, unspecified: Secondary | ICD-10-CM

## 2017-08-14 DIAGNOSIS — I10 Essential (primary) hypertension: Secondary | ICD-10-CM

## 2017-08-14 DIAGNOSIS — N201 Calculus of ureter: Secondary | ICD-10-CM

## 2017-08-14 DIAGNOSIS — N132 Hydronephrosis with renal and ureteral calculous obstruction: Principal | ICD-10-CM

## 2017-08-14 DIAGNOSIS — E039 Hypothyroidism, unspecified: Secondary | ICD-10-CM

## 2017-08-14 DIAGNOSIS — E440 Moderate protein-calorie malnutrition: Secondary | ICD-10-CM

## 2017-08-14 DIAGNOSIS — I77811 Abdominal aortic ectasia: Secondary | ICD-10-CM

## 2017-08-14 DIAGNOSIS — N289 Disorder of kidney and ureter, unspecified: Secondary | ICD-10-CM

## 2017-08-14 HISTORY — PX: CYSTOSCOPY/URETEROSCOPY/HOLMIUM LASER/STENT PLACEMENT: SHX6546

## 2017-08-14 LAB — MAGNESIUM: Magnesium: 1.7 mg/dL (ref 1.7–2.4)

## 2017-08-14 LAB — COMPREHENSIVE METABOLIC PANEL
ALBUMIN: 3.4 g/dL — AB (ref 3.5–5.0)
ALK PHOS: 58 U/L (ref 38–126)
ALT: 12 U/L — ABNORMAL LOW (ref 17–63)
ANION GAP: 6 (ref 5–15)
AST: 25 U/L (ref 15–41)
BILIRUBIN TOTAL: 0.6 mg/dL (ref 0.3–1.2)
BUN: 26 mg/dL — ABNORMAL HIGH (ref 6–20)
CALCIUM: 8.6 mg/dL — AB (ref 8.9–10.3)
CO2: 25 mmol/L (ref 22–32)
Chloride: 109 mmol/L (ref 101–111)
Creatinine, Ser: 1.33 mg/dL — ABNORMAL HIGH (ref 0.61–1.24)
GFR calc non Af Amer: 45 mL/min — ABNORMAL LOW (ref >60)
GFR, EST AFRICAN AMERICAN: 53 mL/min — AB (ref >60)
GLUCOSE: 119 mg/dL — AB (ref 65–99)
Potassium: 4.3 mmol/L (ref 3.5–5.1)
Sodium: 140 mmol/L (ref 135–145)
TOTAL PROTEIN: 6.2 g/dL — AB (ref 6.5–8.1)

## 2017-08-14 LAB — CBC WITH DIFFERENTIAL/PLATELET
Basophils Absolute: 0 10*3/uL (ref 0.0–0.1)
Basophils Relative: 0 %
Eosinophils Absolute: 0 10*3/uL (ref 0.0–0.7)
Eosinophils Relative: 0 %
HEMATOCRIT: 30.6 % — AB (ref 39.0–52.0)
HEMOGLOBIN: 10.3 g/dL — AB (ref 13.0–17.0)
LYMPHS ABS: 1.2 10*3/uL (ref 0.7–4.0)
LYMPHS PCT: 17 %
MCH: 32.8 pg (ref 26.0–34.0)
MCHC: 33.7 g/dL (ref 30.0–36.0)
MCV: 97.5 fL (ref 78.0–100.0)
MONOS PCT: 9 %
Monocytes Absolute: 0.7 10*3/uL (ref 0.1–1.0)
NEUTROS ABS: 5.6 10*3/uL (ref 1.7–7.7)
NEUTROS PCT: 74 %
Platelets: 129 10*3/uL — ABNORMAL LOW (ref 150–400)
RBC: 3.14 MIL/uL — ABNORMAL LOW (ref 4.22–5.81)
RDW: 13.2 % (ref 11.5–15.5)
WBC: 7.5 10*3/uL (ref 4.0–10.5)

## 2017-08-14 LAB — SURGICAL PCR SCREEN
MRSA, PCR: NEGATIVE
Staphylococcus aureus: NEGATIVE

## 2017-08-14 LAB — GLUCOSE, CAPILLARY: GLUCOSE-CAPILLARY: 104 mg/dL — AB (ref 65–99)

## 2017-08-14 LAB — PHOSPHORUS: Phosphorus: 2.6 mg/dL (ref 2.5–4.6)

## 2017-08-14 SURGERY — CYSTOSCOPY/URETEROSCOPY/HOLMIUM LASER/STENT PLACEMENT
Anesthesia: General | Laterality: Right

## 2017-08-14 MED ORDER — LIDOCAINE 2% (20 MG/ML) 5 ML SYRINGE
INTRAMUSCULAR | Status: DC | PRN
  Administered 2017-08-14: 100 mg via INTRAVENOUS

## 2017-08-14 MED ORDER — ONDANSETRON HCL 4 MG/2ML IJ SOLN
INTRAMUSCULAR | Status: AC
  Filled 2017-08-14: qty 2

## 2017-08-14 MED ORDER — DEXAMETHASONE SODIUM PHOSPHATE 10 MG/ML IJ SOLN
INTRAMUSCULAR | Status: AC
  Filled 2017-08-14: qty 1

## 2017-08-14 MED ORDER — DEXAMETHASONE SODIUM PHOSPHATE 10 MG/ML IJ SOLN
INTRAMUSCULAR | Status: DC | PRN
  Administered 2017-08-14: 10 mg via INTRAVENOUS

## 2017-08-14 MED ORDER — LACTATED RINGERS IV SOLN
INTRAVENOUS | Status: DC
  Administered 2017-08-14: 09:00:00 via INTRAVENOUS

## 2017-08-14 MED ORDER — EPHEDRINE SULFATE-NACL 50-0.9 MG/10ML-% IV SOSY
PREFILLED_SYRINGE | INTRAVENOUS | Status: DC | PRN
  Administered 2017-08-14 (×3): 10 mg via INTRAVENOUS

## 2017-08-14 MED ORDER — SODIUM CHLORIDE 0.9 % IR SOLN
Status: DC | PRN
  Administered 2017-08-14: 6000 mL

## 2017-08-14 MED ORDER — IOHEXOL 300 MG/ML  SOLN
INTRAMUSCULAR | Status: DC | PRN
  Administered 2017-08-14: 10 mL

## 2017-08-14 MED ORDER — FENTANYL CITRATE (PF) 100 MCG/2ML IJ SOLN
INTRAMUSCULAR | Status: DC | PRN
  Administered 2017-08-14: 25 ug via INTRAVENOUS

## 2017-08-14 MED ORDER — PROPOFOL 10 MG/ML IV BOLUS
INTRAVENOUS | Status: AC
  Filled 2017-08-14: qty 20

## 2017-08-14 MED ORDER — FENTANYL CITRATE (PF) 100 MCG/2ML IJ SOLN
INTRAMUSCULAR | Status: AC
  Filled 2017-08-14: qty 2

## 2017-08-14 MED ORDER — ZOLPIDEM TARTRATE 5 MG PO TABS
5.0000 mg | ORAL_TABLET | Freq: Once | ORAL | Status: AC
  Administered 2017-08-15: 5 mg via ORAL
  Filled 2017-08-14: qty 1

## 2017-08-14 MED ORDER — ENSURE ENLIVE PO LIQD
237.0000 mL | Freq: Two times a day (BID) | ORAL | Status: DC
  Administered 2017-08-15 (×2): 237 mL via ORAL

## 2017-08-14 MED ORDER — MAGNESIUM SULFATE 2 GM/50ML IV SOLN
2.0000 g | Freq: Once | INTRAVENOUS | Status: AC
  Administered 2017-08-14: 2 g via INTRAVENOUS
  Filled 2017-08-14: qty 50

## 2017-08-14 MED ORDER — LIDOCAINE 2% (20 MG/ML) 5 ML SYRINGE
INTRAMUSCULAR | Status: AC
Start: 2017-08-14 — End: ?
  Filled 2017-08-14: qty 5

## 2017-08-14 MED ORDER — ONDANSETRON HCL 4 MG/2ML IJ SOLN
INTRAMUSCULAR | Status: DC | PRN
  Administered 2017-08-14: 4 mg via INTRAVENOUS

## 2017-08-14 MED ORDER — PROMETHAZINE HCL 25 MG/ML IJ SOLN: 6.2500 mg | INTRAMUSCULAR | Status: DC | PRN

## 2017-08-14 MED ORDER — PROPOFOL 10 MG/ML IV BOLUS
INTRAVENOUS | Status: DC | PRN
  Administered 2017-08-14: 100 mg via INTRAVENOUS
  Administered 2017-08-14 (×2): 20 mg via INTRAVENOUS

## 2017-08-14 MED ORDER — FENTANYL CITRATE (PF) 100 MCG/2ML IJ SOLN: 25.0000 ug | INTRAMUSCULAR | Status: DC | PRN

## 2017-08-14 SURGICAL SUPPLY — 26 items
BAG URO CATCHER STRL LF (MISCELLANEOUS) ×3 IMPLANT
BASKET LASER NITINOL 1.9FR (BASKET) ×3 IMPLANT
BASKET ZERO TIP NITINOL 2.4FR (BASKET) IMPLANT
BSKT STON RTRVL 120 1.9FR (BASKET) ×1
BSKT STON RTRVL ZERO TP 2.4FR (BASKET)
CATH INTERMIT  6FR 70CM (CATHETERS) ×2 IMPLANT
CLOTH BEACON ORANGE TIMEOUT ST (SAFETY) ×3 IMPLANT
COVER FOOTSWITCH UNIV (MISCELLANEOUS) ×2 IMPLANT
COVER SURGICAL LIGHT HANDLE (MISCELLANEOUS) ×1 IMPLANT
EXTRACTOR STONE NITINOL NGAGE (UROLOGICAL SUPPLIES) ×2 IMPLANT
FIBER LASER FLEXIVA 365 (UROLOGICAL SUPPLIES) IMPLANT
FIBER LASER TRAC TIP (UROLOGICAL SUPPLIES) ×2 IMPLANT
GLOVE BIOGEL M 8.0 STRL (GLOVE) ×3 IMPLANT
GOWN STRL REUS W/ TWL XL LVL3 (GOWN DISPOSABLE) ×1 IMPLANT
GOWN STRL REUS W/TWL LRG LVL3 (GOWN DISPOSABLE) ×4 IMPLANT
GOWN STRL REUS W/TWL XL LVL3 (GOWN DISPOSABLE) ×3
GUIDEWIRE ANG ZIPWIRE 038X150 (WIRE) ×3 IMPLANT
GUIDEWIRE STR DUAL SENSOR (WIRE) ×1 IMPLANT
IV NS 1000ML (IV SOLUTION)
IV NS 1000ML BAXH (IV SOLUTION) ×1 IMPLANT
MANIFOLD NEPTUNE II (INSTRUMENTS) ×3 IMPLANT
PACK CYSTO (CUSTOM PROCEDURE TRAY) ×3 IMPLANT
STENT CONTOUR 6FRX26X.038 (STENTS) ×2 IMPLANT
TUBING CONNECTING 10 (TUBING) ×2 IMPLANT
TUBING CONNECTING 10' (TUBING) ×1
TUBING UROLOGY SET (TUBING) ×3 IMPLANT

## 2017-08-14 NOTE — Plan of Care (Signed)
  Progressing Education: Knowledge of General Education information will improve 08/14/2017 2139 - Progressing by Talbert Forest, RN Nutrition: Adequate nutrition will be maintained 08/14/2017 2139 - Progressing by Talbert Forest, RN Elimination: Will not experience complications related to urinary retention 08/14/2017 2139 - Progressing by Talbert Forest, RN Pain Managment: General experience of comfort will improve 08/14/2017 2139 - Progressing by Talbert Forest, RN Skin Integrity: Risk for impaired skin integrity will decrease 08/14/2017 2139 - Progressing by Talbert Forest, RN

## 2017-08-14 NOTE — Transfer of Care (Signed)
Immediate Anesthesia Transfer of Care Note  Patient: George Reid  Procedure(s) Performed: CYSTOSCOPY/URETEROSCOPY/HOLMIUM LASER/STENT PLACEMENT (Right )  Patient Location: PACU  Anesthesia Type:General  Level of Consciousness: sedated  Airway & Oxygen Therapy: Patient Spontanous Breathing and Patient connected to face mask oxygen  Post-op Assessment: Report given to RN and Post -op Vital signs reviewed and stable  Post vital signs: Reviewed and stable  Last Vitals:  Vitals:   08/13/17 2142 08/14/17 0602  BP: (!) 145/61 (!) 128/45  Pulse: 71 66  Resp: 16 18  Temp: 37.7 C 36.8 C  SpO2: 98% 97%    Last Pain:  Vitals:   08/14/17 0602  TempSrc: Oral  PainSc:       Patients Stated Pain Goal: 3 (26/94/85 4627)  Complications: No apparent anesthesia complications

## 2017-08-14 NOTE — Op Note (Signed)
Preoperative diagnosis: 10 x 15 mm right UPJ stone  Postoperative diagnosis: Same  Principal procedure: Cystoscopy, right retrograde ureteropyelogram, fluoroscopic interpretation, holmium laser lithotripsy of right UPJ stone, extraction of stone fragments, placement of 6 French by 26 cm contour double-J stent without tether  Surgeon: Cyntha Brickman  Anesthesia: General with LMA  Complications: None  Estimated blood loss: None  Specimen: Stones, urine from right renal pelvis for culture, apparently contaminated in transport to lab  Drains: 6 [place] by 26 centimeters contour double-J stent without tether  Indications: 84 year old male admitted yesterday with significant right flank pain and a large right proximal ureteral stone that will undoubtedly need management.  The patient was admitted to the medicine service, and at this point, because of his age and social reasons, he is prepared for ureteroscopic management of this large right proximal ureteral stone.  The procedure was discussed with the patient and his daughter, Jana Half last night and this morning.  Risks and complications of the procedure were discussed, including but not limited to infection, ureteral trauma, need for second procedure, anesthetic complications, among others.  The patient and his daughter both understand.  We likewise proceed.  Description of procedure: The patient was properly identified and marked in the holding area.  He has been on IV antibiotics.  He was taken to the operating room where general anesthetic was administered with the LMA.  He was placed in the dorsolithotomy position.  Genitalia and perineum were prepped and draped, proper timeout was performed.  A 22 French panendoscope was advanced under direct vision through his urethra.  Urethra was free of lesions, prostate was nonobstructive.  The bladder was entered and inspected circumferentially.  There were no tumors foreign bodies or trabeculations.  Ureteral  orifices were normal in configuration, location.  The right ureter was cannulated with a 6 [place] open-ended catheter.  Using Omnipaque, retrograde ureteral pyelogram was performed.  This revealed a normal mid and distal ureter.  There was tortuosity of the upper ureter, with an obvious filling defect at the UPJ consistent with the previously  mentioned ureteral calculus.  There was moderate pyelocaliectasis without evident filling defects otherwise.  Using the open-ended catheter which had been passed more proximally, it took a zip wire to negotiate past the stone.  Once the guidewire was present in the upper pole calyceal system, I passed the open-ended catheter over the guidewire, into the renal pelvis.  Hydronephrotic drip was noted.  Urine was sent for culture.  Once the access to the kidney was obtained with the open-ended catheter, the zip wire was removed and a sensor tip guidewire placed.  The open-ended catheter was removed.  The bladder was drained and the scope removed.  The ureter was sequentially dilated with first the inner core and then the entire 12/14 ureteral access catheter, medium length.  This was done without difficulty, without worry about significant trauma to the ureter.  The flexible ureteroscope was then advanced through the access catheter up to the stone which had been dislodged into the renal pelvis at this point.  The stone was fragmented into multiple fragments usin high-right holmium laser energy--for 50 Hz, 0.8 J.  In this manner, multiple tiny fragments were produced, several large fragments which were then evacuated/basket it with an engage basket.  No significant large fragments were remaining at this point.  It was felt that the tiny fragments/dust would pass along the stent.  At this point, after no significant stones were seen within the calyceal system or  the pelvis, the ureteroscope was removed.  The sensor tip guidewire was then replaced in the upper pole calyceal system  through the access catheter.  The guidewire was then backloaded through the cystoscope, and using fluoroscopic and cystoscopic guidance, a 6 [place] by 26 cm contour double-J stent was adequately positioned over top of the guidewire using fluoroscopic and cystoscopic guidance, and good curls were seen proximally and distally.  The bladder was then drained.  The scope was removed.  The patient was then awakened.  He is taken to the PACU, having tolerated the procedure well.

## 2017-08-14 NOTE — Progress Notes (Signed)
Initial Nutrition Assessment  DOCUMENTATION CODES:   Non-severe (moderate) malnutrition in context of chronic illness, Underweight  INTERVENTION:   Ensure Enlive po BID, each supplement provides 350 kcal and 20 grams of protein  Magic Cup po TID, each supplement provides 290 kcal and 9 grams of protein  MVI   NUTRITION DIAGNOSIS:   Moderate Malnutrition related to chronic illness(advanced age) as evidenced by mild fat depletion, moderate muscle depletion.   GOAL:   Patient will meet greater than or equal to 90% of their needs   MONITOR:   PO intake, Supplement acceptance, Weight trends  REASON FOR ASSESSMENT:   Malnutrition Screening Tool    ASSESSMENT:   84 year old male admitted 3/2 with severe RUQ/flank pain secondary to obstructing uretal stone with hydronephrosis. PMH low BMI, HTN, MI, hypothyroidism, s/p esophageal dilation x 2, GERD, anxiety/depression.   Pt is s/p Cystoscopy with extraction of uretal stones. Advanced from NPO to regular diet as of this afternoon. Pt consumed 25-35% of lunch.  S/w pt and pt's daughter. -Pt has experienced significant decline in appetite/thirst affecting PO intake of food and fluids -Pt has decreased interest in foods r/t decreased overall appetite as well as loss of taste, difficulty chewing d/t poor-fitting dentures -Pt also does not tolerate meat well. He has to chew thoroughly to avoid swallowing issues (food stuck in throat that comes back up). Pt has had a couple procedures to dilate esophagus, but refuses to have another procedure.  Per daughter, eats <25% of meals (loses interest in eating after a couple bites). However, she makes sure he consumes an entire Ensure each day. Daughter also reports that pt has progressively lost weight recently-- this weight loss has also been accompanied by a loss in pt's strength  Dietary recall PTA:  B: Cornflakes + whole milk + coffee; occasionally eats mixed fruit cup L: Cookie +  Ensure (likes vanilla) D: Meal prepared by daughter (meat + biscuit/potato; vegetable offered, but pt typically does not eat) Beverages: only drinks ~8oz water/day + <3oz cola to take medications Pt does not take MVI at home  Discussed importance of consuming adequate fluids, protein, calories r/t aging process, retention of lean muscle, and prevention of future urology issues.  Provided recommendations for protein sources that do not require chewing. Encouraged pt to continue drink ensure at home. Pt said that he would try to consume ONS if offered. Also confirmed that he likes ice cream and would try magic cup if offered.   Patient Active Problem List   Diagnosis Date Noted  . Mild renal insufficiency 08/13/2017  . Hypertension 08/13/2017  . Ureteral stone with hydronephrosis 08/13/2017  . Normocytic anemia 08/13/2017  . Aortic ectasia, abdominal ([place]) 08/13/2017  . Obstruction of right ureteropelvic junction (UPJ) due to stone   . Hypothyroidism   -  Per daughter, pt has lost weight in past 3-4 months. UBW was ~ 133lbs; daughter reports pt is now 127 lbs.  Wt Readings from Last 15 Encounters:  08/13/17 128 lb 1.4 oz (58.1 kg)  05/17/12 136 lb 7 oz (61.9 kg)   [facility] 11/14/2016: 59 kg (130 lb)  Medications:  Synthroid Protonix senkot  Labs:  CBG's 108, 104   NUTRITION - FOCUSED PHYSICAL EXAM:    Most Recent Value  Orbital Region  No depletion  Upper Arm Region  Mild depletion  Thoracic and Lumbar Region  No depletion  Buccal Region  Mild depletion  Temple Region  Moderate depletion  Clavicle Bone Region  Mild depletion  Clavicle and Acromion Bone Region  Moderate depletion  Scapular Bone Region  Mild depletion  Dorsal Hand  No depletion  Patellar Region  Moderate depletion  Anterior Thigh Region  Moderate depletion  Posterior Calf Region  Moderate depletion  Edema (RD Assessment)  None  Hair  Reviewed  Eyes  Reviewed  Mouth  Reviewed  Skin  Reviewed   Nails  Reviewed       Diet Order:  Diet regular Room service appropriate? Yes; Fluid consistency: Thin  EDUCATION NEEDS:   Education needs have been addressed  Skin:  Skin Assessment: Reviewed RN Assessment  Last BM:  No BM since PTA; pt denies issues with BM  Height:   Ht Readings from Last 1 Encounters:  08/13/17 6' (1.829 m)    Weight:   Wt Readings from Last 1 Encounters:  08/13/17 128 lb 1.4 oz (58.1 kg)    Ideal Body Weight:     BMI:  Body mass index is 17.37 kg/m.  Estimated Nutritional Needs:   Kcal:  1,450-1,650 (rounded MSJ x 1.1-1.3)  Protein:  70-80 grams (20% kcal/1.2 g/kg BW)  Fluid:  >1.5 Carlean Jews    Edmonia Lynch, MS Dietetic Intern Pager: [phone]

## 2017-08-14 NOTE — Anesthesia Preprocedure Evaluation (Addendum)
Anesthesia Evaluation  Patient identified by MRN, date of birth, ID band Patient awake    Reviewed: Allergy & Precautions, NPO status , Patient's Chart, lab work & pertinent test results  Airway Mallampati: II  TM Distance: >3 FB Neck ROM: Full    Dental  (+) Edentulous Upper   Pulmonary neg pulmonary ROS,    Pulmonary exam normal breath sounds clear to auscultation       Cardiovascular hypertension, + Past MI and + Peripheral Vascular Disease  Normal cardiovascular exam Rhythm:Regular Rate:Normal     Neuro/Psych negative neurological ROS  negative psych ROS   GI/Hepatic negative GI ROS, Neg liver ROS,   Endo/Other  Hypothyroidism   Renal/GU negative Renal ROS  negative genitourinary   Musculoskeletal negative musculoskeletal ROS (+)   Abdominal   Peds negative pediatric ROS (+)  Hematology  (+) anemia ,   Anesthesia Other Findings   Reproductive/Obstetrics negative OB ROS                             Anesthesia Physical Anesthesia Plan  ASA: III  Anesthesia Plan: General   Post-op Pain Management:    Induction: Intravenous  PONV Risk Score and Plan: 2 and Ondansetron and Dexamethasone  Airway Management Planned: LMA  Additional Equipment:   Intra-op Plan:   Post-operative Plan: Extubation in OR  Informed Consent: I have reviewed the patients History and Physical, chart, labs and discussed the procedure including the risks, benefits and alternatives for the proposed anesthesia with the patient or authorized representative who has indicated his/her understanding and acceptance.   Dental advisory given  Plan Discussed with: CRNA and Surgeon  Anesthesia Plan Comments:         Anesthesia Quick Evaluation

## 2017-08-14 NOTE — Discharge Instructions (Signed)

## 2017-08-14 NOTE — Progress Notes (Signed)
PROGRESS NOTE    George Reid  QVZ:[id number] DOB: Jun 05, 1925 DOA: 08/12/2017 PCP: Burnard Bunting, MD   Brief Narrative:  George Reid is a 84 y.o. male with medical history significant for hypertension and hypothyroidism, now presenting to the emergency department for evaluation of acute onset severe pain in the right flank and right lower quadrant.  Patient reports that he had been in his usual state of health earlier today, but noted acute onset of severe pain in the right flank and right lower quadrant of his abdomen this evening.  He describes the pain as constant, sharp, associated with mild nausea and one episode of vomiting.  He has not experienced similar symptoms previously.  He denies any change in his bowel habits, denies fevers or chills, and denies dysuria or hematuria.  CT stone study was obtained and revealed a 10 x 14 mm right UPJ stone with moderate hydronephrosis.  Abdominal aortic ectasia was noted incidentally on the CT with recommendation for abdominal ultrasound in 5 years.  Patient was treated with morphine and prophylactic Rocephin in the ED.  Urine was sent for culture.  Urology was consulted by the ED physician and recommended a medical admission to [facility] for pain control and likely intervention. Urology evaluated and did stone cystoscopy with lithotripsy and Double J Stent placement today.   Assessment & Plan:   Principal Problem:   Ureteral stone with hydronephrosis Active Problems:   Mild renal insufficiency   Hypertension   Normocytic anemia   Aortic ectasia, abdominal (HCC)   Hypothyroidism   Malnutrition of moderate degree  Right UPJ stone with Hydronephrosis  -Presented with acute-onset of severe pain at right flank and lower quadrant  -No fever, leukocytosis, or urinary sxs  -CT reveals 10 x 14 mm right UPJ stone with associated hydronephrosis  -Treated in ED with analgesia and prophylactic Rocephin, urine sent for culture  -Urology is  consulting and much appreciated, Recommended medical admission [facility] for pain-control and likely intervention  -IVF Hydration now stopped, analgesia, infection surveillance -Urinalysis showed Moderate Hgb, No Bacteria, 0-5 WBC, and Negative Leukocytes and Nitrites -C/w IV Ceftriaxone until Urine Cx results  -Urology evaluated and patient underwent Cystoscopy and Lithotripsy and extraction of stone and placement of Double J Stent -Patient had Hematuria after Procedure   Mild Renal Insufficiency  -SCr is 1.36 on admission, up from <1 in 2013 with no more recent values -BUN/Cr now 26/1.33 -Hydrated with NS at 100 mL/hr x12 hours -Renally-dose medications and avoid nephrotoxins -Repeat CMP in AM   Hypertension  -BP elevated in ED with pain likely contributing  -Continue prn analgesia -Continue Verapamil 240 mg po Daily  -Use hydralazine IVP's prn    Hypothyroidism  -Continue Levothyroxine 75 mg po Daily    Aortic Ectasia  -Ectasia of abdominal aorta noted incidentally on CT with recommendation for abdominal US in 5 years   Moderate Malnutrition in the context of Chronic Illness -Nutritionist Consulted and Appreciated Recc's -C/w Ensure Enlive 237 mL po BID  Thrombocytopenia -Patient's Platelet Count went from 157 -> 147 -> 129 -Likely Reactive vs. Abx -Continue to Monitor for S/Sx of Bleeding -Repeat CBC in AM  Normocytic Anemia -Hb/Hct went from 11.1/33.5 ->10.9/32.1 -> 10.3/30.6 -Continue to Monitor for S/Sx of Bleeding -Repeat CBC in AM   Hypomagnesemia -Patient's Mag Level went from 1.6 -> 1.7 -Replete with IV Mag Sulfate 2 grams -Continue to Monitor and Replete as Necessary -Repeat Mag Level in AM  GERD -C/w Pantoprazole  40 mg po Daily  DVT prophylaxis: SCDs Code Status: FULL CODE Family Communication: Discussed with wife at bedside  Disposition Plan:   Consultants:   Urology    Procedures:  Principal procedure: Cystoscopy, right retrograde  ureteropyelogram, fluoroscopic interpretation, holmium laser lithotripsy of right UPJ stone, extraction of stone fragments, placement of 6 French by 26 cm contour double-J stent without tether  Antimicrobials: Anti-infectives (From admission, onward)   Start     Dose/Rate Route Frequency Ordered Stop   08/13/17 2100  cefTRIAXone (ROCEPHIN) 1 g in sodium chloride 0.9 % 100 mL IVPB     1 g 200 mL/hr over 30 Minutes Intravenous  Once 08/13/17 1303 08/13/17 2218   08/12/17 2330  cefTRIAXone (ROCEPHIN) 1 g in sodium chloride 0.9 % 100 mL IVPB     1 g 200 mL/hr over 30 Minutes Intravenous  Once 08/12/17 2325 08/13/17 0009     Subjective: Seen and examined and was wearing a condom cath and commenting on blood in urine. No CP or SOB. Felt ok and denied any flank pain.   Objective: Vitals:   08/14/17 1045 08/14/17 1140 08/14/17 1152 08/14/17 1203  BP: (!) 127/55 (!) 150/65 (!) 143/62 (!) 151/58  Pulse: 79 82 81 80  Resp: 20  (!) 21 20  Temp:  98.9 F (37.2 C) 98.9 F (37.2 C) 99.2 F (37.3 C)  TempSrc:    Oral  SpO2: 100% 95% 97% 96%  Weight:      Height:        Intake/Output Summary (Last 24 hours) at 08/14/2017 1943 Last data filed at 08/14/2017 1459 Gross per 24 hour  Intake 1621.66 ml  Output 105 ml  Net 1516.66 ml   Filed Weights   08/13/17 0212  Weight: 58.1 kg (128 lb 1.4 oz)   Examination: Physical Exam:  Constitutional: Thin elderly Caucasian male in NAD and appears calm and comfortable Eyes: Lids and conjunctivae normal, sclerae anicteric  ENMT: External Ears, Nose appear normal. Grossly normal hearing. Mucous membranes are moist.  Neck: Appears normal, supple, no cervical masses, normal ROM, no appreciable thyromegaly, no JVD Respiratory: Diminished to auscultation bilaterally, no wheezing, rales, rhonchi or crackles. Normal respiratory effort and patient is not tachypenic. No accessory muscle use.  Cardiovascular: RRR, no murmurs / rubs / gallops. S1 and S2  auscultated. No extremity edema.  Abdomen: Soft, non-tender, non-distended. No masses palpated. No appreciable hepatosplenomegaly. Bowel sounds positive x4  GU: Deferred. Wearing Condom Cath with Bloody urine in foley bag.  Musculoskeletal: No clubbing / cyanosis of digits/nails. No joint deformity upper and lower extremities.   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 No rashes or lesions. No induration; Warm and dry.  Neurologic: CN 2-12 grossly intact with no focal deficits. Romberg sign and cerebellar reflexes not assessed.  Psychiatric: Normal judgment and insight. Alert and oriented x 3. Normal mood and appropriate affect.   Data Reviewed: I have personally reviewed following labs and imaging studies  CBC: Recent Labs  Lab 08/12/17 1839 08/13/17 0839 08/14/17 0605  WBC 5.7 6.4 7.5  NEUTROABS  --  4.7 5.6  HGB 11.1* 10.9* 10.3*  HCT 33.5* 32.1* 30.6*  MCV 96.3 94.7 97.5  PLT 157 147* 379*   Basic Metabolic Panel: Recent Labs  Lab 08/12/17 1839 08/13/17 0839 08/14/17 0605  NA 137 139 140  K 3.7 4.3 4.3  CL 102 105 109  CO2 24 24 25   GLUCOSE 153* 118* 119*  BUN 23* 25* 26*  CREATININE  1.36* 1.25* 1.33*  CALCIUM 9.3 8.8* 8.6*  MG  --  1.6* 1.7  PHOS  --  3.0 2.6   GFR: Estimated Creatinine Clearance: 30.3 mL/min (A) (by C-G formula based on SCr of 1.33 mg/dL (H)). Liver Function Tests: Recent Labs  Lab 08/12/17 1839 08/13/17 0839 08/14/17 0605  AST 28 24 25   ALT 13* 12* 12*  ALKPHOS 71 65 58  BILITOT 0.4 0.4 0.6  PROT 7.2 6.4* 6.2*  ALBUMIN 4.2 3.7 3.4*   Recent Labs  Lab 08/12/17 1839  LIPASE 38   No results for input(s): AMMONIA in the last 168 hours. Coagulation Profile: No results for input(s): INR, PROTIME in the last 168 hours. Cardiac Enzymes: No results for input(s): CKTOTAL, CKMB, CKMBINDEX, TROPONINI in the last 168 hours. BNP (last 3 results) No results for input(s): PROBNP in the last 8760 hours. HbA1C: No results for input(s): HGBA1C in the  last 72 hours. CBG: Recent Labs  Lab 08/13/17 0746 08/14/17 0725  GLUCAP 108* 104*   Lipid Profile: No results for input(s): CHOL, HDL, LDLCALC, TRIG, CHOLHDL, LDLDIRECT in the last 72 hours. Thyroid Function Tests: No results for input(s): TSH, T4TOTAL, FREET4, T3FREE, THYROIDAB in the last 72 hours. Anemia Panel: No results for input(s): VITAMINB12, FOLATE, FERRITIN, TIBC, IRON, RETICCTPCT in the last 72 hours. Sepsis Labs: No results for input(s): PROCALCITON, LATICACIDVEN in the last 168 hours.  Recent Results (from the past 240 hour(s))  Surgical pcr screen     Status: None   Collection Time: 08/14/17  8:19 AM  Result Value Ref Range Status   MRSA, PCR NEGATIVE NEGATIVE Final   Staphylococcus aureus NEGATIVE NEGATIVE Final    Comment: (NOTE) The Xpert SA Assay (FDA approved for NASAL specimens in patients 71 years of age and older), is one component of a comprehensive surveillance program. It is not intended to diagnose infection nor to guide or monitor treatment. Performed at [facility], [place] [address] [place], [place] [address]     Radiology Studies: Dg Chest Port 1 View  Result Date: 08/13/2017 CLINICAL DATA:  Preoperative evaluation EXAM: PORTABLE CHEST 1 VIEW COMPARISON:  Chest radiograph 05/17/2012 FINDINGS: Stable cardiac and mediastinal contours. Aortic atherosclerosis. No consolidative pulmonary opacities. No pleural effusion or pneumothorax. IMPRESSION: No acute cardiopulmonary process. Electronically Signed   By: Lovey Newcomer M.D.   On: 08/13/2017 19:03   Dg C-arm 1-60 Min-no Report  Result Date: 08/14/2017 Fluoroscopy was utilized by the requesting physician.  No radiographic interpretation.   Ct Renal Stone Study  Result Date: 08/12/2017 CLINICAL DATA:  84 year old with flank pain. EXAM: CT ABDOMEN AND PELVIS WITHOUT CONTRAST TECHNIQUE: Multidetector CT imaging of the abdomen and pelvis was performed following the standard protocol  without IV contrast. COMPARISON:  None. FINDINGS: Lower chest: Chronic changes with scattered pulmonary cysts. Subpleural reticulations suggest fibrosis. No pleural fluid. There is moderate hiatal hernia. Hepatobiliary: Calcified gallstones within physiologically distended gallbladder. No pericholecystic inflammation. No evidence of focal hepatic lesion allowing for lack contrast. Pancreas: No ductal dilatation or inflammation. Spleen: Normal in size without focal abnormality. Adrenals/Urinary Tract: No adrenal nodule. Ovoid 10 x 14 mm stone in the right ureteropelvic junction with moderate right hydroureteronephrosis. Mild right perinephric edema. Right ureter distally is decompressed. Probable extrarenal pelvis configuration of the left kidney without left hydronephrosis. No left urolithiasis. Urinary bladder is partially distended. Questionable wall thickening of the bladder dome. No bladder stones. Stomach/Bowel: Colonic diverticulosis, prominent in the distal descending and sigmoid colon. No  diverticulitis. Normal appendix. No bowel inflammation, wall thickening or obstruction. Moderate hiatal hernia. Stomach is otherwise nondistended. Vascular/Lymphatic: Dense aortic and branch atherosclerosis. Mild ectasia of the aorta measuring 2.6 cm. Small mesenteric nodes in the left abdomen with minimal mesenteric edema, likely reactive. No enlarged abdominal or pelvic lymph nodes. Reproductive: Prominent prostate gland spanning 4.6 cm. Other: No ascites or free air.  Intra-abdominal abscess. Musculoskeletal: T12 compression fracture is chronic, however has likely progressed from July 2016 lumbar spine radiograph. Multilevel degenerative change in the lumbar spine. The bones are under mineralized. IMPRESSION: 1. Obstructing 10 x 14 mm stone at the right ureteropelvic junction with moderate hydronephrosis. 2. Incidental findings of gallstones, moderate hiatal hernia, and colonic diverticulosis. 3. Chronic T12 compression  fracture, with mild progressive loss of height from July 2016 lumbar spine radiograph. 4. Aortic Atherosclerosis (ICD10-I70.0). Aortic ectasia maximal dimension 2.6 cm. Ectatic abdominal aorta at risk for aneurysm development. Recommend followup by ultrasound in 5 years (giving consideration for patient's advanced age. This recommendation follows ACR consensus guidelines: White Paper of the ACR Incidental Findings Committee II on Vascular Findings. J Am Coll Radiol 2013; 10:789-794. 5. Subpleural reticular changes in the lung bases suggest fibrosis. Electronically Signed   By: Jeb Levering M.D.   On: 08/12/2017 23:12   Scheduled Meds: . atorvastatin  40 mg Oral q1800  . [START ON 08/15/2017] feeding supplement (ENSURE ENLIVE)  237 mL Oral BID BM  . levothyroxine  75 mcg Oral QAC breakfast  . pantoprazole  40 mg Oral Daily  . verapamil  240 mg Oral QHS   Continuous Infusions:   LOS: 1 day   Kerney Elbe, DO [facility] Pager [phone]  If 7PM-7AM, please contact night-coverage [email or web address] Password [facility] 08/14/2017, 7:43 PM

## 2017-08-14 NOTE — Anesthesia Procedure Notes (Signed)
Procedure Name: LMA Insertion Date/Time: 08/14/2017 9:20 AM Performed by: Lind Covert, CRNA Pre-anesthesia Checklist: Patient identified, Emergency Drugs available, Suction available, Patient being monitored and Timeout performed Patient Re-evaluated:Patient Re-evaluated prior to induction Oxygen Delivery Method: Circle system utilized Preoxygenation: Pre-oxygenation with 100% oxygen Induction Type: IV induction LMA: LMA inserted LMA Size: 4.0 Number of attempts: 1 Placement Confirmation: positive ETCO2 and breath sounds checked- equal and bilateral Tube secured with: Tape Dental Injury: Teeth and Oropharynx as per pre-operative assessment

## 2017-08-14 NOTE — Anesthesia Postprocedure Evaluation (Signed)
Anesthesia Post Note  Patient: George Reid  Procedure(s) Performed: CYSTOSCOPY/URETEROSCOPY/HOLMIUM LASER/STENT PLACEMENT (Right )     Patient location during evaluation: PACU Anesthesia Type: General Level of consciousness: awake and alert Pain management: pain level controlled Vital Signs Assessment: post-procedure vital signs reviewed and stable Respiratory status: spontaneous breathing, nonlabored ventilation, respiratory function stable and patient connected to nasal cannula oxygen Cardiovascular status: blood pressure returned to baseline and stable Postop Assessment: no apparent nausea or vomiting Anesthetic complications: no Comments: New onset A fib. Stable. Rate is controlled. Primary service to address on tele floor    Last Vitals:  Vitals:   08/14/17 1152 08/14/17 1203  BP: (!) 143/62 (!) 151/58  Pulse: 81 80  Resp: (!) 21 20  Temp:  37.3 C  SpO2:  96%    Last Pain:  Vitals:   08/14/17 1203  TempSrc: Oral  PainSc:                  [name]

## 2017-08-15 LAB — CBC WITH DIFFERENTIAL/PLATELET
BASOS PCT: 0 %
Basophils Absolute: 0 10*3/uL (ref 0.0–0.1)
Eosinophils Absolute: 0 10*3/uL (ref 0.0–0.7)
Eosinophils Relative: 0 %
HEMATOCRIT: 28.4 % — AB (ref 39.0–52.0)
HEMOGLOBIN: 9.7 g/dL — AB (ref 13.0–17.0)
LYMPHS ABS: 0.7 10*3/uL (ref 0.7–4.0)
Lymphocytes Relative: 6 %
MCH: 32.6 pg (ref 26.0–34.0)
MCHC: 34.2 g/dL (ref 30.0–36.0)
MCV: 95.3 fL (ref 78.0–100.0)
MONOS PCT: 5 %
Monocytes Absolute: 0.6 10*3/uL (ref 0.1–1.0)
NEUTROS ABS: 12 10*3/uL — AB (ref 1.7–7.7)
NEUTROS PCT: 89 %
Platelets: 123 10*3/uL — ABNORMAL LOW (ref 150–400)
RBC: 2.98 MIL/uL — AB (ref 4.22–5.81)
RDW: 12.9 % (ref 11.5–15.5)
WBC: 13.3 10*3/uL — AB (ref 4.0–10.5)

## 2017-08-15 LAB — GLUCOSE, CAPILLARY: GLUCOSE-CAPILLARY: 145 mg/dL — AB (ref 65–99)

## 2017-08-15 LAB — URINE CULTURE: CULTURE: NO GROWTH

## 2017-08-15 LAB — COMPREHENSIVE METABOLIC PANEL
ALBUMIN: 3.4 g/dL — AB (ref 3.5–5.0)
ALK PHOS: 54 U/L (ref 38–126)
ALT: 15 U/L — AB (ref 17–63)
ANION GAP: 9 (ref 5–15)
AST: 31 U/L (ref 15–41)
BILIRUBIN TOTAL: 0.7 mg/dL (ref 0.3–1.2)
BUN: 32 mg/dL — AB (ref 6–20)
CALCIUM: 8.7 mg/dL — AB (ref 8.9–10.3)
CO2: 22 mmol/L (ref 22–32)
CREATININE: 1.33 mg/dL — AB (ref 0.61–1.24)
Chloride: 108 mmol/L (ref 101–111)
GFR calc Af Amer: 53 mL/min — ABNORMAL LOW (ref >60)
GFR calc non Af Amer: 45 mL/min — ABNORMAL LOW (ref >60)
Glucose, Bld: 165 mg/dL — ABNORMAL HIGH (ref 65–99)
Potassium: 4.4 mmol/L (ref 3.5–5.1)
Sodium: 139 mmol/L (ref 135–145)
TOTAL PROTEIN: 6.6 g/dL (ref 6.5–8.1)

## 2017-08-15 LAB — PHOSPHORUS: Phosphorus: 3.1 mg/dL (ref 2.5–4.6)

## 2017-08-15 LAB — MAGNESIUM: Magnesium: 2.1 mg/dL (ref 1.7–2.4)

## 2017-08-15 MED ORDER — SENNOSIDES-DOCUSATE SODIUM 8.6-50 MG PO TABS: 1.0000 | ORAL_TABLET | Freq: Every evening | ORAL | 0 refills | Status: AC | PRN

## 2017-08-15 MED ORDER — ATORVASTATIN CALCIUM 40 MG PO TABS: 40.0000 mg | ORAL_TABLET | Freq: Every day | ORAL | 0 refills | Status: AC

## 2017-08-15 MED ORDER — VERAPAMIL HCL ER 240 MG PO TBCR: 240.0000 mg | EXTENDED_RELEASE_TABLET | Freq: Every day | ORAL | 0 refills | Status: AC

## 2017-08-15 MED ORDER — ENSURE ENLIVE PO LIQD: 237.0000 mL | Freq: Two times a day (BID) | ORAL | 12 refills | Status: AC

## 2017-08-15 NOTE — Consult Note (Signed)
   [facility] CM Inpatient Consult   08/15/2017  George Reid 1924-10-28 [id number]    Patient screened for potential [facility] Care Management program due to having [employer] insurance.   Spoke with George Reid and his daughter, George Reid, at bedside. Discussed [facility] Care Management services. Both patient and George Reid pleasantly decline [facility] Care Management services. Decline EMMI transition calls as well.   Denies having any transportation, disease management, or care coordination needs.   Provided [facility] Care Management brochure with contact information and 24-hr nurse advice line magnet.  Appreciative of visit.   Will make inpatient RNCM aware that Mr. Meyers pleasantly decline [facility] Care Management follow up.   Marthenia Rolling, MSN-Ed, RN,BSN [place] Liaison [phone]

## 2017-08-15 NOTE — Evaluation (Signed)
Physical Therapy Evaluation Patient Details Name: George Reid MRN: [id number] DOB: 1924/09/04 Today's Date: 08/15/2017   History of Present Illness  TEDRICK PORT is a 84 y.o. male with medical history significant for hypertension and hypothyroidism admitted with acute onset of severe pain in the right flank and right lower quadrant of his abdomen, found to have 10 x 15 mm right UPJ stone, now s/p cystoscopy with lithotripsy and removal of stone fragments.   Clinical Impression  Patient presents with decreased independence with mobility due to deficits listed in PT problem list.  He will benefit from skilled PT in the acute setting and follow up HHPT at d/c.  Wife able to assist and pt currently minguard to S for safety.     Follow Up Recommendations Home health PT;Supervision/Assistance - 24 hour    Equipment Recommendations  3in1 (PT)    Recommendations for Other Services       Precautions / Restrictions Precautions Precautions: Fall Precaution Comments: wife reports two recent falls      Mobility  Bed Mobility Overal bed mobility: Needs Assistance Bed Mobility: Supine to Sit     Supine to sit: HOB elevated;Supervision     General bed mobility comments: for safety with foley  Transfers Overall transfer level: Needs assistance Equipment used: Rolling walker (2 wheeled) Transfers: Sit to/from Stand Sit to Stand: Min guard         General transfer comment: cues for hand placement  Ambulation/Gait Ambulation/Gait assistance: Min guard;Supervision Ambulation Distance (Feet): 200 Feet Assistive device: Rolling walker (2 wheeled) Gait Pattern/deviations: Step-through pattern;Decreased stride length;Shuffle;Trunk flexed     General Gait Details: assist and cues to stay inside walker; wife observed and reports she has a gait belt  Stairs Stairs: Yes Stairs assistance: Min assist Stair Management: One rail Right;Step to pattern;Alternating pattern Number of  Stairs: 3 General stair comments: assist for safety   Wheelchair Mobility    Modified Rankin (Stroke Patients Only)       Balance Overall balance assessment: Needs assistance   Sitting balance-Leahy Scale: Good       Standing balance-Leahy Scale: Fair Standing balance comment: can stand without UE support, but needs walker for ambulation                             Pertinent Vitals/Pain Pain Assessment: No/denies pain    Home Living Family/patient expects to be discharged to:: Private residence Living Arrangements: Spouse/significant other Available Help at Discharge: Family Type of Home: House Home Access: Stairs to enter Entrance Stairs-Rails: Right Entrance Stairs-Number of Steps: 3 Home Layout: One level(2 steps to sunroom) Home Equipment: Walker - 2 wheels;Walker - 4 wheels;Shower seat      Prior Function Level of Independence: Needs assistance   Gait / Transfers Assistance Needed: walks without device, but wife reports off balance a lot  ADL's / Homemaking Assistance Needed: wife sometimes helps with buttons        Hand Dominance        Extremity/Trunk Assessment   Upper Extremity Assessment Upper Extremity Assessment: Generalized weakness    Lower Extremity Assessment Lower Extremity Assessment: Generalized weakness    Cervical / Trunk Assessment Cervical / Trunk Assessment: Kyphotic;Other exceptions Cervical / Trunk Exceptions: severe kyphoscoliosis with angulation about T10  Communication   Communication: No difficulties  Cognition Arousal/Alertness: Awake/alert Behavior During Therapy: WFL for tasks assessed/performed Overall Cognitive Status: History of cognitive impairments - at baseline  General Comments      Exercises     Assessment/Plan    PT Assessment Patient needs continued PT services  PT Problem List Decreased strength;Decreased mobility;Decreased  balance;Decreased knowledge of use of DME;Decreased safety awareness       PT Treatment Interventions DME instruction;Therapeutic activities;Gait training;Patient/family education;Therapeutic exercise;Stair training;Balance training;Functional mobility training    PT Goals (Current goals can be found in the Care Plan section)  Acute Rehab PT Goals Patient Stated Goal: to get more balanced PT Goal Formulation: With patient/family Time For Goal Achievement: 08/29/17 Potential to Achieve Goals: Good    Frequency Min 3X/week   Barriers to discharge        Co-evaluation               AM-PAC PT "6 Clicks" Daily Activity  Outcome Measure Difficulty turning over in bed (including adjusting bedclothes, sheets and blankets)?: A Lot Difficulty moving from lying on back to sitting on the side of the bed? : A Lot Difficulty sitting down on and standing up from a chair with arms (e.g., wheelchair, bedside commode, etc,.)?: Unable Help needed moving to and from a bed to chair (including a wheelchair)?: A Little Help needed walking in hospital room?: A Little Help needed climbing 3-5 steps with a railing? : A Little 6 Click Score: 14    End of Session Equipment Utilized During Treatment: Gait belt Activity Tolerance: Patient tolerated treatment well Patient left: with call bell/phone within reach;in chair;with family/visitor present   PT Visit Diagnosis: Other abnormalities of gait and mobility (R26.89);History of falling (Z91.81);Muscle weakness (generalized) (M62.81)    Time: 6433-2951 PT Time Calculation (min) (ACUTE ONLY): 28 min   Charges:   PT Evaluation $PT Eval Moderate Complexity: 1 Mod PT Treatments $Gait Training: 8-22 mins   PT G CodesMagda Kiel, [place] [phone] 08/15/2017   Reginia Naas 08/15/2017, 1:26 PM

## 2017-08-15 NOTE — Care Management Note (Signed)
Case Management Note  Patient Details  Name: DEONDRA WIGGER MRN: [id number] Date of Birth: [birth year]  Subjective/Objective: PT recc HHPT. Ordered 3n1. [facility] chosen for [employer] aware to deliver 3n1 to rm prior d/c. No further CM needs.                   Action/Plan:d/c home w/HHC/dme.   Expected Discharge Date:  08/15/17               Expected Discharge Plan:  [place]  In-House Referral:     Discharge planning Services  CM Consult  Post Acute Care Choice:    Choice offered to:  Spouse  DME Arranged:  3-N-1 DME Agency:  [place] Arranged:  PT, OT, Nurse's Aide [place] Agency:  [place]  Status of Service:  Completed, signed off  If discussed at [place] of Stay Meetings, dates discussed:    Additional Comments:  Dessa Phi, RN 08/15/2017, 2:39 PM

## 2017-08-15 NOTE — Discharge Summary (Signed)
Physician Discharge Summary  AVON MERGENTHALER KGU:[id number] DOB: [birth year] DOA: 08/12/2017  PCP: Burnard Bunting, MD  Admit date: 08/12/2017 Discharge date: 08/15/2017  Admitted From: Home Disposition: Home with Home Health PT  Recommendations for Outpatient Follow-up:  1. Follow up with PCP in 1-2 weeks 2. Follow up with Urology within 1 week 3. Follow up with Cardiology as an outpatient  4. Please obtain CMP/CBC in one week 5. Please follow up on the following pending results:  Home Health: Yes Equipment/Devices: 3 in 1  Discharge Condition: Stable CODE STATUS: FULL CODE   Diet recommendation: Heart Healthy Diet   Brief/Interim Summary: George Reid a 84 y.o.malewith medical history significant forhypertension and hypothyroidism, now presenting to the emergency department for evaluation of acute onset severe pain in the right flank and right lower quadrant. Patient reports that he had been in his usual state of health earlier today, but noted acute onset of severe pain in the right flank and right lower quadrant of his abdomen this evening. He describes the pain as constant, sharp, associated with mild nausea and one episode of vomiting. He has not experienced similar symptoms previously. He denies any change in his bowel habits, denies fevers or chills, and denies dysuria or hematuria.  CT stone study was obtained and revealed a 10 x 14 mm right UPJ stone with moderate hydronephrosis. Abdominal aortic ectasia was noted incidentally on the CT with recommendation for abdominal ultrasound in 5 years. Patient was treated with morphine and prophylactic Rocephin in the ED. Urine was sent for culture. Urology was consulted by the ED physician and recommended a medical admission to [facility] for pain control and likely intervention. Urology evaluated and did stone cystoscopy with lithotripsy and Double J Stent placement and patient improved. Case was discussed with Dr.  Jess Barters of Urology who recommended no Abx at D/C. Patient improved and PT evaluated and deemed patient to go home Home with Home Health. He was D/C'd Home as he was stable and will follow up with Urology in the coming weeks.  Discharge Diagnoses:  Principal Problem:   Ureteral stone with hydronephrosis Active Problems:   Mild renal insufficiency   Hypertension   Normocytic anemia   Aortic ectasia, abdominal (HCC)   Hypothyroidism   Malnutrition of moderate degree  Right UPJ stone with Hydronephrosis, improved  -Presented with acute-onset of severe pain at right flank and lower quadrant -No fever, leukocytosis, or urinary sxs -CT reveals 10 x 14 mm right UPJ stone with associated hydronephrosis -Treated in ED with analgesia and prophylactic Rocephin, urine sent for culture -Urology is consulting and much appreciated, Recommended medical admission [facility] for pain-control and likely intervention -IVF Hydration now stopped, analgesia, infection surveillance -Urinalysis showed Moderate Hgb, No Bacteria, 0-5 WBC, and Negative Leukocytes and Nitrites;  -Given IV Ceftriaxone but stopped when Urine Cx results showed no Growth -Urology evaluated and patient underwent Cystoscopy and Lithotripsy and extraction of stone and placement of Double J Stent -Patient had Hematuria after Procedure -Improved and deemed stable to D/C Home with Urology Follow up  Transient A Fib with RVR, Resolved -Had post-procedure A Fib and now in NSR -No Anticoagulation given High risk of Falls -C/w Verapamil as an outpatient -Follow up with Cardiology as an outpatient   Mild Renal Insufficiency / CKD Stage 3 -SCr is 1.36 on admission, up from <1 in 2013 with no more recent values -BUN/Cr now 32/1.33 -Hydrated with NS at 100 mL/hr x12 hours with no real improvement -Renally-dose  medications and avoid nephrotoxins -Repeat CMP as an outpatient   Hypertension -BP elevated in ED with pain likely  contributing -Continue prn analgesia -Continue Verapamil 240 mg po Daily  -Use hydralazine IVP's prn  Hypothyroidism -Continue Levothyroxine 75 mg po Daily   Aortic Ectasia  -Ectasia of abdominal aorta noted incidentally on CT with recommendation for abdominal US in 5 years  Moderate Malnutrition in the context of Chronic Illness -Nutritionist Consulted and Appreciated Recc's -C/w Ensure Enlive 237 mL po BID  Thrombocytopenia -Patient's Platelet Count went from 157 -> 147 -> 129 -> 123 -Likely Reactive vs. Abx -Continue to Monitor for S/Sx of Bleeding -Repeat CBC as an outpatient   Normocytic Anemia -Hb/Hct went from 11.1/33.5 ->10.9/32.1 -> 10.3/30.6 -> 9.7/28.4 -Had some Hematuria after procedure -Continue to Monitor for S/Sx of Bleeding -Repeat CBC as an outpatient    Hypomagnesemia -Patient's Mag Level went from 1.6 -> 1.7 -> 2.1 -Continue to Monitor and Replete as Necessary -Repeat Mag Level as an outpatient   Leukocytosis  -Patient's WBC went from 7.5 -> 13.3 -Likely reactive from procedure -Continue to Monitor for S/Sx of Infection -Repeat CBC in AM   GERD -C/w Pantoprazole 40 mg po Daily  Discharge Instructions  Discharge Instructions    Call MD for:  difficulty breathing, headache or visual disturbances   Complete by:  As directed    Call MD for:  extreme fatigue   Complete by:  As directed    Call MD for:  hives   Complete by:  As directed    Call MD for:  persistant dizziness or light-headedness   Complete by:  As directed    Call MD for:  persistant nausea and vomiting   Complete by:  As directed    Call MD for:  redness, tenderness, or signs of infection (pain, swelling, redness, odor or green/yellow discharge around incision site)   Complete by:  As directed    Call MD for:  severe uncontrolled pain   Complete by:  As directed    Call MD for:  temperature >100.4   Complete by:  As directed    Diet - low sodium heart healthy    Complete by:  As directed    Discharge instructions   Complete by:  As directed    Follow up with PCP and Urology as an outpatient. Take all medications as prescribed. If symptoms change or worsen please return to the ED for evaluation.   Increase activity slowly   Complete by:  As directed      Allergies as of 08/15/2017   No Known Allergies     Medication List    STOP taking these medications   simvastatin 80 MG tablet Commonly known as:  ZOCOR Replaced by:  atorvastatin 40 MG tablet     TAKE these medications   aspirin 81 MG tablet Take 81 mg by mouth daily.   atorvastatin 40 MG tablet Commonly known as:  LIPITOR Take 1 tablet (40 mg total) by mouth daily at 6 PM. Replaces:  simvastatin 80 MG tablet   escitalopram 10 MG tablet Commonly known as:  LEXAPRO Take 10 mg by mouth daily.   feeding supplement (ENSURE ENLIVE) Liqd Take 237 mLs by mouth 2 (two) times daily between meals.   levothyroxine 88 MCG tablet Commonly known as:  SYNTHROID, LEVOTHROID Take 88 mcg by mouth daily before breakfast.   omeprazole 20 MG capsule Commonly known as:  PRILOSEC Take 20 mg by mouth daily.  senna-docusate 8.6-50 MG tablet Commonly known as:  Senokot-S Take 1 tablet by mouth at bedtime as needed for mild constipation.   verapamil 240 MG CR tablet Commonly known as:  CALAN-SR Take 1 tablet (240 mg total) by mouth at bedtime.      Follow-up Information    Franchot Gallo, MD Follow up.   Specialty:  Urology Why:  We will call you to set up appointment for follow-up Contact information: [place] [address] [phone]        Burnard Bunting, MD. Call.   Specialty:  Internal Medicine Why:  Follow up within 1 week Contact information: [address] [place] [address] [place] Follow up.   Why:  bedside Contact information: [address] [place] [address] [phone]         [facility] Follow up.   Specialty:  [place] Why:  [facility] physical therapy/occupational therapy/aide Contact information: [address] [place] [address] [phone]          No Known Allergies  Consultations:  Urology Dr. McCormick/Dr. Diona Fanti  Procedures/Studies: Dg Chest Port 1 View  Result Date: 08/13/2017 CLINICAL DATA:  Preoperative evaluation EXAM: PORTABLE CHEST 1 VIEW COMPARISON:  Chest radiograph 05/17/2012 FINDINGS: Stable cardiac and mediastinal contours. Aortic atherosclerosis. No consolidative pulmonary opacities. No pleural effusion or pneumothorax. IMPRESSION: No acute cardiopulmonary process. Electronically Signed   By: Lovey Newcomer M.D.   On: 08/13/2017 19:03   Dg C-arm 1-60 Min-no Report  Result Date: 08/14/2017 Fluoroscopy was utilized by the requesting physician.  No radiographic interpretation.   Ct Renal Stone Study  Result Date: 08/12/2017 CLINICAL DATA:  84 year old with flank pain. EXAM: CT ABDOMEN AND PELVIS WITHOUT CONTRAST TECHNIQUE: Multidetector CT imaging of the abdomen and pelvis was performed following the standard protocol without IV contrast. COMPARISON:  None. FINDINGS: Lower chest: Chronic changes with scattered pulmonary cysts. Subpleural reticulations suggest fibrosis. No pleural fluid. There is moderate hiatal hernia. Hepatobiliary: Calcified gallstones within physiologically distended gallbladder. No pericholecystic inflammation. No evidence of focal hepatic lesion allowing for lack contrast. Pancreas: No ductal dilatation or inflammation. Spleen: Normal in size without focal abnormality. Adrenals/Urinary Tract: No adrenal nodule. Ovoid 10 x 14 mm stone in the right ureteropelvic junction with moderate right hydroureteronephrosis. Mild right perinephric edema. Right ureter distally is decompressed. Probable extrarenal pelvis configuration of the left kidney without left hydronephrosis. No left urolithiasis.  Urinary bladder is partially distended. Questionable wall thickening of the bladder dome. No bladder stones. Stomach/Bowel: Colonic diverticulosis, prominent in the distal descending and sigmoid colon. No diverticulitis. Normal appendix. No bowel inflammation, wall thickening or obstruction. Moderate hiatal hernia. Stomach is otherwise nondistended. Vascular/Lymphatic: Dense aortic and branch atherosclerosis. Mild ectasia of the aorta measuring 2.6 cm. Small mesenteric nodes in the left abdomen with minimal mesenteric edema, likely reactive. No enlarged abdominal or pelvic lymph nodes. Reproductive: Prominent prostate gland spanning 4.6 cm. Other: No ascites or free air.  Intra-abdominal abscess. Musculoskeletal: T12 compression fracture is chronic, however has likely progressed from July 2016 lumbar spine radiograph. Multilevel degenerative change in the lumbar spine. The bones are under mineralized. IMPRESSION: 1. Obstructing 10 x 14 mm stone at the right ureteropelvic junction with moderate hydronephrosis. 2. Incidental findings of gallstones, moderate hiatal hernia, and colonic diverticulosis. 3. Chronic T12 compression fracture, with mild progressive loss of height from July 2016 lumbar spine radiograph. 4. Aortic Atherosclerosis (  ICD10-I70.0). Aortic ectasia maximal dimension 2.6 cm. Ectatic abdominal aorta at risk for aneurysm development. Recommend followup by ultrasound in 5 years (giving consideration for patient's advanced age. This recommendation follows ACR consensus guidelines: White Paper of the ACR Incidental Findings Committee II on Vascular Findings. J Am Coll Radiol 2013; 10:789-794. 5. Subpleural reticular changes in the lung bases suggest fibrosis. Electronically Signed   By: Jeb Levering M.D.   On: 08/12/2017 23:12     Subjective: Seen and examined at bedside and had improved. Wife stated he does not eat much. No CP, SOB, or Abdominal Pain. No nausea or vomiting. No other concerns or  complaints at this time.   Discharge Exam: Vitals:   08/14/17 2156 08/15/17 0538  BP: 129/65 (!) 105/49  Pulse: 78 63  Resp: 18 20  Temp: 99.7 F (37.6 C) 97.6 F (36.4 C)  SpO2: 96% 96%   Vitals:   08/14/17 1152 08/14/17 1203 08/14/17 2156 08/15/17 0538  BP: (!) 143/62 (!) 151/58 129/65 (!) 105/49  Pulse: 81 80 78 63  Resp: (!) 21 20 18 20   Temp: 98.9 F (37.2 C) 99.2 F (37.3 C) 99.7 F (37.6 C) 97.6 F (36.4 C)  TempSrc:  Oral Oral Oral  SpO2: 97% 96% 96% 96%  Weight:      Height:       General: Pt is alert, awake, not in acute distress Cardiovascular: RRR, S1/S2 +, no rubs, no gallops Respiratory: Diminished bilaterally, no wheezing, no rhonchi Abdominal: Soft, NT, ND, bowel sounds + Extremities: no edema, no cyanosis  The results of significant diagnostics from this hospitalization (including imaging, microbiology, ancillary and laboratory) are listed below for reference.    Microbiology: Recent Results (from the past 240 hour(s))  Urine Culture     Status: None   Collection Time: 08/12/17 11:23 PM  Result Value Ref Range Status   Specimen Description URINE, CLEAN CATCH  Final   Special Requests NONE  Final   Culture   Final    NO GROWTH Performed at [facility], [address], [place], [place] [address]    Report Status 08/15/2017 FINAL  Final  Surgical pcr screen     Status: None   Collection Time: 08/14/17  8:19 AM  Result Value Ref Range Status   MRSA, PCR NEGATIVE NEGATIVE Final   Staphylococcus aureus NEGATIVE NEGATIVE Final    Comment: (NOTE) The Xpert SA Assay (FDA approved for NASAL specimens in patients 14 years of age and older), is one component of a comprehensive surveillance program. It is not intended to diagnose infection nor to guide or monitor treatment. Performed at [facility], [place] [address] [place], [place] [address]      Labs: BNP (last 3 results) No results for input(s): BNP in the last 8760  hours. Basic Metabolic Panel: Recent Labs  Lab 08/12/17 1839 08/13/17 0839 08/14/17 0605 08/15/17 0519  NA 137 139 140 139  K 3.7 4.3 4.3 4.4  CL 102 105 109 108  CO2 24 24 25 22   GLUCOSE 153* 118* 119* 165*  BUN 23* 25* 26* 32*  CREATININE 1.36* 1.25* 1.33* 1.33*  CALCIUM 9.3 8.8* 8.6* 8.7*  MG  --  1.6* 1.7 2.1  PHOS  --  3.0 2.6 3.1   Liver Function Tests: Recent Labs  Lab 08/12/17 1839 08/13/17 0839 08/14/17 0605 08/15/17 0519  AST 28 24 25 31   ALT 13* 12* 12* 15*  ALKPHOS 71 65 58 54  BILITOT 0.4 0.4 0.6 0.7  PROT 7.2 6.4* 6.2* 6.6  ALBUMIN 4.2 3.7 3.4* 3.4*   Recent Labs  Lab 08/12/17 1839  LIPASE 38   No results for input(s): AMMONIA in the last 168 hours. CBC: Recent Labs  Lab 08/12/17 1839 08/13/17 0839 08/14/17 0605 08/15/17 0519  WBC 5.7 6.4 7.5 13.3*  NEUTROABS  --  4.7 5.6 12.0*  HGB 11.1* 10.9* 10.3* 9.7*  HCT 33.5* 32.1* 30.6* 28.4*  MCV 96.3 94.7 97.5 95.3  PLT 157 147* 129* 123*   Cardiac Enzymes: No results for input(s): CKTOTAL, CKMB, CKMBINDEX, TROPONINI in the last 168 hours. BNP: Invalid input(s): POCBNP CBG: Recent Labs  Lab 08/13/17 0746 08/14/17 0725 08/15/17 0759  GLUCAP 108* 104* 145*   D-Dimer No results for input(s): DDIMER in the last 72 hours. Hgb A1c No results for input(s): HGBA1C in the last 72 hours. Lipid Profile No results for input(s): CHOL, HDL, LDLCALC, TRIG, CHOLHDL, LDLDIRECT in the last 72 hours. Thyroid function studies No results for input(s): TSH, T4TOTAL, T3FREE, THYROIDAB in the last 72 hours.  Invalid input(s): FREET3 Anemia work up No results for input(s): VITAMINB12, FOLATE, FERRITIN, TIBC, IRON, RETICCTPCT in the last 72 hours. Urinalysis    Component Value Date/Time   COLORURINE YELLOW 08/12/2017 1839   APPEARANCEUR CLEAR 08/12/2017 1839   LABSPEC 1.020 08/12/2017 1839   PHURINE 5.0 08/12/2017 1839   GLUCOSEU NEGATIVE 08/12/2017 1839   HGBUR MODERATE (A) 08/12/2017 1839    BILIRUBINUR NEGATIVE 08/12/2017 1839   KETONESUR NEGATIVE 08/12/2017 1839   PROTEINUR NEGATIVE 08/12/2017 1839   NITRITE NEGATIVE 08/12/2017 1839   LEUKOCYTESUR NEGATIVE 08/12/2017 1839   Sepsis Labs Invalid input(s): PROCALCITONIN,  WBC,  LACTICIDVEN Microbiology Recent Results (from the past 240 hour(s))  Urine Culture     Status: None   Collection Time: 08/12/17 11:23 PM  Result Value Ref Range Status   Specimen Description URINE, CLEAN CATCH  Final   Special Requests NONE  Final   Culture   Final    NO GROWTH Performed at [facility], [place] [address], [place], [place] [address]    Report Status 08/15/2017 FINAL  Final  Surgical pcr screen     Status: None   Collection Time: 08/14/17  8:19 AM  Result Value Ref Range Status   MRSA, PCR NEGATIVE NEGATIVE Final   Staphylococcus aureus NEGATIVE NEGATIVE Final    Comment: (NOTE) The Xpert SA Assay (FDA approved for NASAL specimens in patients 51 years of age and older), is one component of a comprehensive surveillance program. It is not intended to diagnose infection nor to guide or monitor treatment. Performed at [facility], [place] [address] [place], [place] [address]    Time coordinating discharge: 35 minutes  SIGNED:  Kerney Elbe, DO [facility] 08/15/2017, 11:30 PM Pager [phone]  If 7PM-7AM, please contact night-coverage [email or web address] Password [email or web address]

## 2017-08-15 NOTE — Progress Notes (Signed)
Urology Progress Note   1 Day Post-Op  Subjective: Patient states he feels improved this morning, though his wife tells me that he had a tough night, with some apparent delirium - tearing off his heart monitor, getting in and out of bed, etc.   Objective: Vital signs in last 24 hours: Temp:  [97.6 F (36.4 C)-99.7 F (37.6 C)] 97.6 F (36.4 C) (03/05 0538) Pulse Rate:  [63-82] 63 (03/05 0538) Resp:  [18-21] 20 (03/05 0538) BP: (105-151)/(49-65) 105/49 (03/05 0538) SpO2:  [95 %-100 %] 96 % (03/05 0538)  Intake/Output from previous day: 03/04 0701 - 03/05 0700 In: 1050 [P.O.:250; I.V.:800] Out: 480 [Urine:480] Intake/Output this shift: No intake/output data recorded.  Physical Exam:  General: Alert and oriented CV: Regular rate Lungs: Clear Abdomen: Soft, appropriately tender.  GU: Condom catheter in place draining clear yellow urine  Ext: NT, No erythema  Lab Results: Recent Labs    08/13/17 0839 08/14/17 0605 08/15/17 0519  HGB 10.9* 10.3* 9.7*  HCT 32.1* 30.6* 28.4*   BMET Recent Labs    08/14/17 0605 08/15/17 0519  NA 140 139  K 4.3 4.4  CL 109 108  CO2 25 22  GLUCOSE 119* 165*  BUN 26* 32*  CREATININE 1.33* 1.33*  CALCIUM 8.6* 8.7*     Studies/Results: Dg Chest Port 1 View  Result Date: 08/13/2017 CLINICAL DATA:  Preoperative evaluation EXAM: PORTABLE CHEST 1 VIEW COMPARISON:  Chest radiograph 05/17/2012 FINDINGS: Stable cardiac and mediastinal contours. Aortic atherosclerosis. No consolidative pulmonary opacities. No pleural effusion or pneumothorax. IMPRESSION: No acute cardiopulmonary process. Electronically Signed   By: Lovey Newcomer M.D.   On: 08/13/2017 19:03   Dg C-arm 1-60 Min-no Report  Result Date: 08/14/2017 Fluoroscopy was utilized by the requesting physician.  No radiographic interpretation.    Assessment/Plan:  84 y.o. male s/p right ureteroscopic stone removal on 08/14/17.  Immediately post-op experienced new-onset A-fib with RVR. Rate  controlled this morning. Cr stable at 1.33.   - Ok for discharge from a urology standpoint.  - Will have the patient follow up with [place] in 1-2 weeks for stent removal in office.  - Urology will continue to follow peripherally. Please page with any questions or concerns.     LOS: 2 days   Stasia Cavalier 08/15/2017, 7:45 AM

## 2017-08-16 DIAGNOSIS — I252 Old myocardial infarction: Secondary | ICD-10-CM | POA: Diagnosis not present

## 2017-08-16 DIAGNOSIS — M6281 Muscle weakness (generalized): Secondary | ICD-10-CM | POA: Diagnosis not present

## 2017-08-16 DIAGNOSIS — I77819 Aortic ectasia, unspecified site: Secondary | ICD-10-CM | POA: Diagnosis not present

## 2017-08-16 DIAGNOSIS — I10 Essential (primary) hypertension: Secondary | ICD-10-CM | POA: Diagnosis not present

## 2017-08-16 DIAGNOSIS — F3289 Other specified depressive episodes: Secondary | ICD-10-CM | POA: Diagnosis not present

## 2017-08-16 DIAGNOSIS — Z7982 Long term (current) use of aspirin: Secondary | ICD-10-CM | POA: Diagnosis not present

## 2017-08-16 DIAGNOSIS — N200 Calculus of kidney: Secondary | ICD-10-CM | POA: Diagnosis not present

## 2017-08-16 DIAGNOSIS — E098 Drug or chemical induced diabetes mellitus with unspecified complications: Secondary | ICD-10-CM | POA: Diagnosis not present

## 2017-08-16 DIAGNOSIS — N289 Disorder of kidney and ureter, unspecified: Secondary | ICD-10-CM | POA: Diagnosis not present

## 2017-08-16 DIAGNOSIS — E039 Hypothyroidism, unspecified: Secondary | ICD-10-CM | POA: Diagnosis not present

## 2017-08-16 DIAGNOSIS — N1330 Unspecified hydronephrosis: Secondary | ICD-10-CM | POA: Diagnosis not present

## 2017-08-16 DIAGNOSIS — K219 Gastro-esophageal reflux disease without esophagitis: Secondary | ICD-10-CM | POA: Diagnosis not present

## 2017-08-16 DIAGNOSIS — F329 Major depressive disorder, single episode, unspecified: Secondary | ICD-10-CM | POA: Diagnosis not present

## 2017-08-16 DIAGNOSIS — M1991 Primary osteoarthritis, unspecified site: Secondary | ICD-10-CM | POA: Diagnosis not present

## 2017-08-16 DIAGNOSIS — N2889 Other specified disorders of kidney and ureter: Secondary | ICD-10-CM | POA: Diagnosis not present

## 2017-08-18 DIAGNOSIS — N132 Hydronephrosis with renal and ureteral calculous obstruction: Secondary | ICD-10-CM | POA: Diagnosis not present

## 2017-08-18 DIAGNOSIS — R8290 Unspecified abnormal findings in urine: Secondary | ICD-10-CM | POA: Diagnosis not present

## 2017-08-18 DIAGNOSIS — N390 Urinary tract infection, site not specified: Secondary | ICD-10-CM | POA: Diagnosis not present

## 2017-08-24 DIAGNOSIS — R5383 Other fatigue: Secondary | ICD-10-CM | POA: Diagnosis not present

## 2017-08-24 DIAGNOSIS — Z681 Body mass index (BMI) 19 or less, adult: Secondary | ICD-10-CM | POA: Diagnosis not present

## 2017-08-24 DIAGNOSIS — K5909 Other constipation: Secondary | ICD-10-CM | POA: Diagnosis not present

## 2017-08-29 DIAGNOSIS — N200 Calculus of kidney: Secondary | ICD-10-CM | POA: Diagnosis not present

## 2017-09-11 DIAGNOSIS — Z681 Body mass index (BMI) 19 or less, adult: Secondary | ICD-10-CM | POA: Diagnosis not present

## 2017-09-11 DIAGNOSIS — E7849 Other hyperlipidemia: Secondary | ICD-10-CM | POA: Diagnosis not present

## 2017-09-11 DIAGNOSIS — I10 Essential (primary) hypertension: Secondary | ICD-10-CM | POA: Diagnosis not present

## 2017-09-11 DIAGNOSIS — E038 Other specified hypothyroidism: Secondary | ICD-10-CM | POA: Diagnosis not present

## 2017-09-11 DIAGNOSIS — I130 Hypertensive heart and chronic kidney disease with heart failure and stage 1 through stage 4 chronic kidney disease, or unspecified chronic kidney disease: Secondary | ICD-10-CM | POA: Diagnosis not present

## 2017-09-11 DIAGNOSIS — F329 Major depressive disorder, single episode, unspecified: Secondary | ICD-10-CM | POA: Diagnosis not present

## 2017-09-13 DIAGNOSIS — N390 Urinary tract infection, site not specified: Secondary | ICD-10-CM | POA: Diagnosis not present

## 2017-09-13 DIAGNOSIS — N132 Hydronephrosis with renal and ureteral calculous obstruction: Secondary | ICD-10-CM | POA: Diagnosis not present

## 2017-09-13 DIAGNOSIS — I10 Essential (primary) hypertension: Secondary | ICD-10-CM | POA: Diagnosis not present

## 2017-09-13 DIAGNOSIS — E038 Other specified hypothyroidism: Secondary | ICD-10-CM | POA: Diagnosis not present

## 2017-09-13 DIAGNOSIS — N289 Disorder of kidney and ureter, unspecified: Secondary | ICD-10-CM | POA: Diagnosis not present

## 2017-09-13 DIAGNOSIS — R5381 Other malaise: Secondary | ICD-10-CM | POA: Diagnosis not present

## 2017-09-13 DIAGNOSIS — Z681 Body mass index (BMI) 19 or less, adult: Secondary | ICD-10-CM | POA: Diagnosis not present

## 2017-09-13 DIAGNOSIS — I77819 Aortic ectasia, unspecified site: Secondary | ICD-10-CM | POA: Diagnosis not present

## 2018-03-07 DIAGNOSIS — H16142 Punctate keratitis, left eye: Secondary | ICD-10-CM | POA: Diagnosis not present

## 2018-03-12 DIAGNOSIS — E038 Other specified hypothyroidism: Secondary | ICD-10-CM | POA: Diagnosis not present

## 2018-03-12 DIAGNOSIS — R82998 Other abnormal findings in urine: Secondary | ICD-10-CM | POA: Diagnosis not present

## 2018-03-12 DIAGNOSIS — I130 Hypertensive heart and chronic kidney disease with heart failure and stage 1 through stage 4 chronic kidney disease, or unspecified chronic kidney disease: Secondary | ICD-10-CM | POA: Diagnosis not present

## 2018-03-12 DIAGNOSIS — I10 Essential (primary) hypertension: Secondary | ICD-10-CM | POA: Diagnosis not present

## 2018-03-12 DIAGNOSIS — Z125 Encounter for screening for malignant neoplasm of prostate: Secondary | ICD-10-CM | POA: Diagnosis not present

## 2018-03-12 DIAGNOSIS — E7849 Other hyperlipidemia: Secondary | ICD-10-CM | POA: Diagnosis not present

## 2018-03-19 DIAGNOSIS — Z0000 Encounter for general adult medical examination without abnormal findings: Secondary | ICD-10-CM | POA: Diagnosis not present

## 2018-03-19 DIAGNOSIS — I77819 Aortic ectasia, unspecified site: Secondary | ICD-10-CM | POA: Diagnosis not present

## 2018-03-19 DIAGNOSIS — Z1389 Encounter for screening for other disorder: Secondary | ICD-10-CM | POA: Diagnosis not present

## 2018-03-19 DIAGNOSIS — E7849 Other hyperlipidemia: Secondary | ICD-10-CM | POA: Diagnosis not present

## 2018-03-19 DIAGNOSIS — Z681 Body mass index (BMI) 19 or less, adult: Secondary | ICD-10-CM | POA: Diagnosis not present

## 2018-03-19 DIAGNOSIS — M546 Pain in thoracic spine: Secondary | ICD-10-CM | POA: Diagnosis not present

## 2018-03-19 DIAGNOSIS — Z23 Encounter for immunization: Secondary | ICD-10-CM | POA: Diagnosis not present

## 2018-03-19 DIAGNOSIS — I2510 Atherosclerotic heart disease of native coronary artery without angina pectoris: Secondary | ICD-10-CM | POA: Diagnosis not present

## 2018-03-19 DIAGNOSIS — E038 Other specified hypothyroidism: Secondary | ICD-10-CM | POA: Diagnosis not present

## 2018-03-19 DIAGNOSIS — I10 Essential (primary) hypertension: Secondary | ICD-10-CM | POA: Diagnosis not present

## 2018-03-19 DIAGNOSIS — I5022 Chronic systolic (congestive) heart failure: Secondary | ICD-10-CM | POA: Diagnosis not present

## 2018-03-19 DIAGNOSIS — I130 Hypertensive heart and chronic kidney disease with heart failure and stage 1 through stage 4 chronic kidney disease, or unspecified chronic kidney disease: Secondary | ICD-10-CM | POA: Diagnosis not present

## 2018-05-02 DIAGNOSIS — H02125 Mechanical ectropion of left lower eyelid: Secondary | ICD-10-CM | POA: Diagnosis not present

## 2018-05-08 DIAGNOSIS — C44222 Squamous cell carcinoma of skin of right ear and external auricular canal: Secondary | ICD-10-CM | POA: Diagnosis not present

## 2018-05-09 NOTE — Progress Notes (Signed)
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   Past/Anticipated interventions by patient's surgeon/dermatologist for current problematic lesion, if any: Pt's other physicians did not feel surgery was safe.  Past skin cancers, if any:  1) Location/Histology/Intervention: 10/16/15 crown scalp; SCC  2) Location/Histology/Intervention: 07/08/15 crown scalp; SCC   History of Blistering sunburns, if any: Pt denies any blistering sunburns; wife states "probably" [place]  SAFETY ISSUES:  Prior radiation? No  Pacemaker/ICD? No  Possible current pregnancy? N/A, pt is male  Is the patient on methotrexate? No  Current Complaints / other details:  Pt presents today for consult with Dr. Sondra Come for Radiation Oncology. Pt with large mass on LEFT lower ear, which is pulling lower eye lid downward and causing facial droop. Pt with little vision in left eye. Pt reports poor hearing in left ear. Pt is accompanied by wife.   BP (!) 157/73 (BP Location: Left Arm, Patient Position: Sitting)   Pulse 85   Temp 98.2 F (36.8 C) (Oral)   Resp 20   Ht 6' (1.829 m)   Wt 123 lb (55.8 kg)   SpO2 100%   BMI 16.68 kg/m   Loma Sousa, RN BSN

## 2018-05-15 ENCOUNTER — Encounter: Payer: Self-pay | Admitting: Radiation Oncology

## 2018-05-15 ENCOUNTER — Ambulatory Visit
Admission: RE | Admit: 2018-05-15 | Discharge: 2018-05-15 | Disposition: A | Discharge status: Home / Self Care | Payer: PPO | Source: Ambulatory Visit | Attending: Radiation Oncology | Admitting: Radiation Oncology

## 2018-05-15 ENCOUNTER — Other Ambulatory Visit: Payer: Self-pay

## 2018-05-15 VITALS — BP 157/73 | HR 85 | Temp 98.2°F | Resp 20 | Ht 72.0 in | Wt 123.0 lb

## 2018-05-15 DIAGNOSIS — C44229 Squamous cell carcinoma of skin of left ear and external auricular canal: Secondary | ICD-10-CM

## 2018-05-15 DIAGNOSIS — Z510 Encounter for antineoplastic radiation therapy: Secondary | ICD-10-CM | POA: Diagnosis not present

## 2018-05-15 DIAGNOSIS — C4492 Squamous cell carcinoma of skin, unspecified: Secondary | ICD-10-CM

## 2018-05-15 NOTE — Progress Notes (Signed)
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  HISTORY OF PRESENT ILLNESS::George Reid is a 84 y.o. male who is seen out of the courtesy of Dr. Dayton Martes for an opinion concerning radiation therapy as part of management of patient's advanced squamous cell carcinoma presenting along the left ear. Patient initially presented with a lesion along the anti tragus of the left ear. A biopsy of this area in May 2018 revealed invasive squamous cell carcinoma, poorly differentiated with adenoidal features. The peripheral and deep margins were involved. Patient was referred to Dr. Wilburn Cornelia and then referred to cardiology for clearance. Given the patient's cardiac history he was not felt to be a candidate for general anesthesia.  Patient at that point was lost to follow-up and returned recently to Dr. Juel Burrow office after the patient developed significant bleeding from the lesion. Lesion had significantly progressed over the  intervening months. Patient is now seen in radiation oncology to be considered for treatment.Marland Kitchen  PREVIOUS RADIATION THERAPY: No  PAST MEDICAL HISTORY:  has a past medical history of Arthritis, Depression, GERD (gastroesophageal reflux disease), Hyperthyroidism, and Myocardial infarction ([facility]) (1992).    PAST SURGICAL HISTORY: Past Surgical History:  Procedure Laterality Date  . CYSTOSCOPY/URETEROSCOPY/HOLMIUM LASER/STENT PLACEMENT Right 08/14/2017   Procedure: CYSTOSCOPY/URETEROSCOPY/HOLMIUM LASER/STENT PLACEMENT;  Surgeon: Franchot Gallo, MD;  Location: [facility];  Service: Urology;  Laterality: Right;  . ESOPHAGEAL DILATION     x2- years ago- refused to do it again- states that  Prilosec helps with the choaking.  Marland Kitchen EYE SURGERY     cataract bil  . INGUINAL HERNIA REPAIR     Right  . LUMBAR LAMINECTOMY/DECOMPRESSION MICRODISCECTOMY  05/18/2012   Procedure: LUMBAR LAMINECTOMY/DECOMPRESSION MICRODISCECTOMY 1 LEVEL;  Surgeon: Winfield Cunas, MD;  Location: [facility];  Service: Neurosurgery;  Laterality: Right;  RIGHT Lumbar four-five diskectomy    FAMILY HISTORY: family history is not on file.  SOCIAL HISTORY:  reports that he has never smoked. He has never used smokeless tobacco. He reports that he does not drink alcohol or use drugs.  ALLERGIES: Patient has no known allergies.  MEDICATIONS:  Current Outpatient Medications  Medication Sig Dispense Refill  . aspirin 81 MG tablet Take 81 mg by mouth daily.    Marland Kitchen atorvastatin (LIPITOR) 40 MG tablet Take 1 tablet (40 mg total) by mouth daily at 6 PM. 30 tablet 0  . escitalopram (LEXAPRO) 10 MG tablet Take 10 mg by mouth daily.    . feeding supplement, ENSURE ENLIVE, (ENSURE ENLIVE) LIQD Take 237 mLs by mouth 2 (two) times daily between meals. 237 mL 12  . levothyroxine (SYNTHROID, LEVOTHROID) 88 MCG tablet Take 88 mcg by mouth daily before breakfast.    . naproxen sodium (ALEVE) 220 MG tablet Take 220 mg by mouth 2 (two) times daily as needed.    Marland Kitchen omeprazole (PRILOSEC) 20 MG capsule Take 20 mg by mouth daily.    . verapamil (CALAN-SR) 240 MG CR tablet Take 1 tablet (240 mg total) by mouth at bedtime. 30 tablet 0  . senna-docusate (SENOKOT-S) 8.6-50 MG tablet Take 1 tablet by mouth at bedtime as needed for mild constipation. (Patient not taking: Reported on 05/15/2018) 30 tablet  0   No current facility-administered medications for this encounter.     REVIEW OF SYSTEMS: REVIEW OF SYSTEMS: A 10+ POINT REVIEW OF SYSTEMS WAS OBTAINED including neurology, dermatology, psychiatry, cardiac, respiratory, lymph, extremities, GI, GU, musculoskeletal, constitutional, reproductive, HEENT. All pertinent positives are noted in the  HPI. All others are negative.   PHYSICAL EXAM:  height is 6' (1.829 m) and weight is 123 lb (55.8 kg). His oral temperature is 98.2 F (36.8 C). His blood pressure is 157/73 (abnormal) and his pulse is 85. His respiration is 20 and oxygen saturation is 100%.   This is a very pleasant 84 year old gentleman in no acute distress. He is accompanied by his wife whom I treated for breast cancer many years ago. Patient does have difficulties with balance and a needed assistance in getting up on the examination table. He has obvious left facial paralysis. The lower portion of his left ear is destroyed by exophytic tumor that is estimated to be approximately 6 x 6 cm. See images below. No active bleeding or drainage noted from the lesion at this time.  Lungs are clear to auscultation bilaterally. Heart has regular rate and rhythm. No palpable cervical, supraclavicular, or axillary adenopathy. Abdomen soft, non-tender, normal bowel sounds. Motor strength is 5 out of 5 in the proximal and distal muscle groups of the upper and lower extremities.         ECOG = 2  0 - Asymptomatic (Fully active, able to carry on all predisease activities without restriction)  1 - Symptomatic but completely ambulatory (Restricted in physically strenuous activity but ambulatory and able to carry out work of a light or sedentary nature. For example, light housework, office work)  2 - Symptomatic, <50% in bed during the day (Ambulatory and capable of all self care but unable to carry out any work activities. Up and about more than 50% of waking hours)  3 - Symptomatic, >50% in bed, but not bedbound (Capable of only limited self-care, confined to bed or chair 50% or more of waking hours)  4 - Bedbound (Completely disabled. Cannot carry on any self-care. Totally confined to bed or chair)  5 - Death   Eustace Pen MM, Creech RH, Tormey DC, et al. [phone]). "Toxicity and response criteria of the [facility]". [place] Oncol. 5 (6): 649-55  LABORATORY DATA:  Lab Results  Component Value Date   WBC 13.3 (H) 08/15/2017   HGB 9.7 (L) 08/15/2017   HCT 28.4 (L) 08/15/2017   MCV 95.3 08/15/2017   PLT 123 (L) 08/15/2017   NEUTROABS 12.0 (H) 08/15/2017   Lab Results  Component Value Date   NA 139 08/15/2017   K 4.4 08/15/2017   CL 108 08/15/2017   CO2 22 08/15/2017   GLUCOSE 165 (H) 08/15/2017   CREATININE 1.33 (H) 08/15/2017   CALCIUM 8.7 (L) 08/15/2017      RADIOGRAPHY: No results found.    IMPRESSION: advanced squamous cell carcinoma of the left ear. As above the patient was not a surgical candidate several months ago due to cardiac history and other medical issues. Patient is symptomatic with some pain in this area as well as drainage and occasional episodes of significant bleeding. He would be a good candidate for palliative radiation therapy directed at this area. I discussed the course of treatment side effects and potential toxicities of palliative radiation therapy with the patient and his wife(health care power of attorney). They appear to understand wish him  to  proceed with planned course of treatment.  PLAN:patient will return on Thursday, December 5 for planning with treatments to begin early next week.Given his performance status we will proceed with every other day treatments over approximately 3 weeks.     ------------------------------------------------  Blair Promise, PhD, MD

## 2018-05-17 ENCOUNTER — Ambulatory Visit
Admission: RE | Admit: 2018-05-17 | Discharge: 2018-05-17 | Disposition: A | Discharge status: Home / Self Care | Payer: PPO | Source: Ambulatory Visit | Attending: Radiation Oncology | Admitting: Radiation Oncology

## 2018-05-17 DIAGNOSIS — Z510 Encounter for antineoplastic radiation therapy: Secondary | ICD-10-CM | POA: Diagnosis not present

## 2018-05-17 DIAGNOSIS — C44229 Squamous cell carcinoma of skin of left ear and external auricular canal: Secondary | ICD-10-CM

## 2018-05-17 NOTE — Progress Notes (Signed)
  Radiation Oncology         [phone] ________________________________  Name: George Reid MRN: [id number]  Date: 05/17/2018  DOB: 1925/04/22  SIMULATION AND TREATMENT PLANNING NOTE    ICD-10-CM   1. 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  Identity was confirmed.  All relevant records and images related to the planned course of therapy were reviewed.  The patient freely provided informed written consent to proceed with treatment after reviewing the details related to the planned course of therapy. The consent form was witnessed and verified by the simulation staff.  Then, the patient was set-up in a stable reproducible  supine position for radiation therapy.  CT images were obtained.  Surface markings were placed.  The CT images were loaded into the planning software.  Then the target and avoidance structures were contoured.  Treatment planning then occurred.  The radiation prescription was entered and confirmed.  Then, I designed and supervised the construction of a total of 5 medically necessary complex treatment devices.  I have requested : Isodose Plan.  I have ordered:dose calc.  PLAN:  The patient will receive 40 Gy in 10 fractions. Patient will be treated every other day.  -----------------------------------  Blair Promise, PhD, MD  This document serves as a record of services personally performed by Gery Pray, MD. It was created on his behalf by Wilburn Mylar, a trained medical scribe. The creation of this record is based on the scribe's personal observations and the provider's statements to them. This document has been checked and approved by the attending provider.

## 2018-05-22 DIAGNOSIS — C44229 Squamous cell carcinoma of skin of left ear and external auricular canal: Secondary | ICD-10-CM | POA: Diagnosis not present

## 2018-05-22 DIAGNOSIS — Z510 Encounter for antineoplastic radiation therapy: Secondary | ICD-10-CM | POA: Diagnosis not present

## 2018-05-23 ENCOUNTER — Ambulatory Visit
Admission: RE | Admit: 2018-05-23 | Discharge: 2018-05-23 | Disposition: A | Discharge status: Home / Self Care | Payer: PPO | Source: Ambulatory Visit | Attending: Radiation Oncology | Admitting: Radiation Oncology

## 2018-05-23 DIAGNOSIS — C44229 Squamous cell carcinoma of skin of left ear and external auricular canal: Secondary | ICD-10-CM | POA: Diagnosis not present

## 2018-05-23 DIAGNOSIS — Z510 Encounter for antineoplastic radiation therapy: Secondary | ICD-10-CM | POA: Diagnosis not present

## 2018-05-23 NOTE — Progress Notes (Signed)
  Radiation Oncology         [phone] ________________________________  Name: George Reid MRN: [id number]  Date: 05/23/2018  DOB: 01-19-1925  Simulation Verification Note    ICD-10-CM   1. 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 The clinical setup was verified. Then port films were obtained and uploaded to the radiation oncology medical record software.  The treatment beams were carefully compared against the planned radiation fields. The position location and shape of the radiation fields was reviewed. They targeted volume of tissue appears to be appropriately covered by the radiation beams. Organs at risk appear to be excluded as planned.  Based on my personal review, I approved the simulation verification. The patient's treatment will proceed as planned.  -----------------------------------  Blair Promise, PhD, MD  This document serves as a record of services personally performed by Gery Pray, MD. It was created on his behalf by Mary-Margaret Loma Messing, a trained medical scribe. The creation of this record is based on the scribe's personal observations and the provider's statements to them. This document has been checked and approved by the attending provider.

## 2018-05-25 ENCOUNTER — Ambulatory Visit: Payer: PPO

## 2018-05-25 ENCOUNTER — Ambulatory Visit
Admission: RE | Admit: 2018-05-25 | Discharge: 2018-05-25 | Disposition: A | Discharge status: Home / Self Care | Payer: PPO | Source: Ambulatory Visit | Attending: Radiation Oncology | Admitting: Radiation Oncology

## 2018-05-25 DIAGNOSIS — Z510 Encounter for antineoplastic radiation therapy: Secondary | ICD-10-CM | POA: Diagnosis not present

## 2018-05-28 ENCOUNTER — Ambulatory Visit: Payer: PPO

## 2018-05-28 ENCOUNTER — Ambulatory Visit
Admission: RE | Admit: 2018-05-28 | Discharge: 2018-05-28 | Disposition: A | Discharge status: Home / Self Care | Payer: PPO | Source: Ambulatory Visit | Attending: Radiation Oncology | Admitting: Radiation Oncology

## 2018-05-28 DIAGNOSIS — Z510 Encounter for antineoplastic radiation therapy: Secondary | ICD-10-CM | POA: Diagnosis not present

## 2018-05-30 ENCOUNTER — Ambulatory Visit
Admission: RE | Admit: 2018-05-30 | Discharge: 2018-05-30 | Disposition: A | Discharge status: Home / Self Care | Payer: PPO | Source: Ambulatory Visit | Attending: Radiation Oncology | Admitting: Radiation Oncology

## 2018-05-30 ENCOUNTER — Ambulatory Visit: Payer: PPO

## 2018-05-30 DIAGNOSIS — Z510 Encounter for antineoplastic radiation therapy: Secondary | ICD-10-CM | POA: Diagnosis not present

## 2018-06-01 ENCOUNTER — Ambulatory Visit
Admission: RE | Admit: 2018-06-01 | Discharge: 2018-06-01 | Disposition: A | Discharge status: Home / Self Care | Payer: PPO | Source: Ambulatory Visit | Attending: Radiation Oncology | Admitting: Radiation Oncology

## 2018-06-01 ENCOUNTER — Ambulatory Visit: Payer: PPO

## 2018-06-01 DIAGNOSIS — C44229 Squamous cell carcinoma of skin of left ear and external auricular canal: Secondary | ICD-10-CM | POA: Diagnosis not present

## 2018-06-01 DIAGNOSIS — Z510 Encounter for antineoplastic radiation therapy: Secondary | ICD-10-CM | POA: Diagnosis not present

## 2018-06-04 ENCOUNTER — Ambulatory Visit
Admission: RE | Admit: 2018-06-04 | Discharge: 2018-06-04 | Disposition: A | Discharge status: Home / Self Care | Payer: PPO | Source: Ambulatory Visit | Attending: Radiation Oncology | Admitting: Radiation Oncology

## 2018-06-04 ENCOUNTER — Ambulatory Visit: Payer: PPO

## 2018-06-04 DIAGNOSIS — Z510 Encounter for antineoplastic radiation therapy: Secondary | ICD-10-CM | POA: Diagnosis not present

## 2018-06-07 ENCOUNTER — Ambulatory Visit
Admission: RE | Admit: 2018-06-07 | Discharge: 2018-06-07 | Disposition: A | Discharge status: Home / Self Care | Payer: PPO | Source: Ambulatory Visit | Attending: Radiation Oncology | Admitting: Radiation Oncology

## 2018-06-07 DIAGNOSIS — Z510 Encounter for antineoplastic radiation therapy: Secondary | ICD-10-CM | POA: Diagnosis not present

## 2018-06-08 ENCOUNTER — Ambulatory Visit: Payer: PPO

## 2018-06-11 ENCOUNTER — Other Ambulatory Visit: Payer: Self-pay | Admitting: Radiation Oncology

## 2018-06-11 ENCOUNTER — Ambulatory Visit
Admission: RE | Admit: 2018-06-11 | Discharge: 2018-06-11 | Disposition: A | Discharge status: Home / Self Care | Payer: PPO | Source: Ambulatory Visit | Attending: Radiation Oncology | Admitting: Radiation Oncology

## 2018-06-11 ENCOUNTER — Ambulatory Visit: Payer: PPO

## 2018-06-11 DIAGNOSIS — Z510 Encounter for antineoplastic radiation therapy: Secondary | ICD-10-CM | POA: Diagnosis not present

## 2018-06-11 MED ORDER — TRAMADOL HCL 50 MG PO TABS: 50.0000 mg | ORAL_TABLET | Freq: Four times a day (QID) | ORAL | 0 refills | Status: AC | PRN

## 2018-06-14 ENCOUNTER — Ambulatory Visit
Admission: RE | Admit: 2018-06-14 | Discharge: 2018-06-14 | Disposition: A | Discharge status: Home / Self Care | Payer: PPO | Source: Ambulatory Visit | Attending: Radiation Oncology | Admitting: Radiation Oncology

## 2018-06-14 DIAGNOSIS — Z510 Encounter for antineoplastic radiation therapy: Secondary | ICD-10-CM | POA: Diagnosis not present

## 2018-06-14 DIAGNOSIS — C44229 Squamous cell carcinoma of skin of left ear and external auricular canal: Secondary | ICD-10-CM | POA: Insufficient documentation

## 2018-06-15 ENCOUNTER — Telehealth: Payer: Self-pay

## 2018-06-15 ENCOUNTER — Ambulatory Visit: Payer: PPO

## 2018-06-15 NOTE — Telephone Encounter (Signed)
Niece calling to see if Dr. Sondra Come will order referral to hospice. Conveyed to niece that Dr. Sondra Come was out of office today but that this RN would relay message. Niece verbalized understanding and agreement. Loma Sousa, RN BSN

## 2018-06-16 ENCOUNTER — Encounter: Payer: Self-pay | Admitting: Radiation Oncology

## 2018-06-16 NOTE — Progress Notes (Signed)
  Radiation Oncology         [phone] ________________________________  Name: George Reid MRN: [id number]  Date: 05/23/18  DOB: Mar 02, 1925  Weekly Radiation Therapy Management   ICD-10-CM    1. 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 . . . . . . . The patient presents for routine under treatment assessment. The patient was seen on the treatment machine. Date of treatment is 05/23/2018 His set up confirmed. No active bleeding at this time                                   The patient is without complaint.                                 Set-up films were reviewed.                                 The chart was checked. Physical Findings. . .  Weight essentially stable.  No significant changes. Impression . . . . . . . The patient is tolerating radiation. Plan . . . . . . . . . . . . Continue treatment as planned. He will be treated every other day for cumulative dose of 40 gray.  ________________________________   Blair Promise, PhD, MD

## 2018-06-18 ENCOUNTER — Ambulatory Visit: Payer: PPO

## 2018-06-18 ENCOUNTER — Ambulatory Visit
Admission: RE | Admit: 2018-06-18 | Discharge: 2018-06-18 | Disposition: A | Discharge status: Home / Self Care | Payer: PPO | Source: Ambulatory Visit | Attending: Radiation Oncology | Admitting: Radiation Oncology

## 2018-06-18 ENCOUNTER — Encounter: Payer: Self-pay | Admitting: Radiation Oncology

## 2018-06-18 ENCOUNTER — Telehealth: Payer: Self-pay

## 2018-06-18 DIAGNOSIS — Z510 Encounter for antineoplastic radiation therapy: Secondary | ICD-10-CM | POA: Diagnosis not present

## 2018-06-18 DIAGNOSIS — C44229 Squamous cell carcinoma of skin of left ear and external auricular canal: Secondary | ICD-10-CM | POA: Diagnosis not present

## 2018-06-18 NOTE — Telephone Encounter (Signed)
Daughter calling to report that daughter called EMS on Friday because pt had swollen lips, tongue, and face. Daughter stated that pt refused to be transported to [facility]. Daughter states that she will have to ask a neighbor for assistance to get pt in car to transport him to [facility] for radiation treatment. Daughter requesting pt to be seen by Dr. Sondra Come. Conveyed to daughter that Dr. Sondra Come had a full clinic day but that pt could be seen before or after radiation treatment. Daughter verbalized understanding and agreement. Loma Sousa, RN BSN

## 2018-06-21 NOTE — Progress Notes (Signed)
  Radiation Oncology         [phone] ________________________________  Name: George Reid MRN: [id number]  Date: 06/18/2018  DOB: 03-12-1925  End of Treatment Note  Diagnosis:   85 y.o. male with Advanced squamous cell carcinoma of the left ear    Indication for treatment:  Palliative       Radiation treatment dates:   05/23/2018 - 06/18/2018  Site/dose:   Left Pinna / 40 Gy in 10 fractions, treated every other day  Beams/energy:   Rhinelander / 6X Photon  Narrative: The patient tolerated radiation treatment relatively well.   He developed pain to his left eye and occasional difficulty opening his right eye. On exam, there are no appreciable changes in the patient's left eye. He was given prescription pain medication as he does not have control of his pain with any over-the-counter pain medications or prescription eye drops. Patient's daughter states that on Friday (Jan. 3), patient's face was swollen and he had difficulty swallowing and speaking. EMS was called and administered Benadryl shot. Daughter states patient has improved. On exam, there is significant tumor shrinkage with yellowish drainage noted throughout the tumor bed; no signs of infection.   At this time, PO intake is poor with patient only consuming Boost, water, or soda. Patient reports he is never hungry. Family is interested in palliative care consult.   Plan: The patient has completed radiation treatment. The patient will return to radiation oncology clinic for close followup in one week. I advised them to call or return sooner if they have any questions or concerns related to their recovery or treatment. I recommend the daughter speak with Dr. Burnard Bunting the patient's primary care physician for hospice referral.  -----------------------------------  Blair Promise, PhD, MD  This document serves as a record of services personally performed by Gery Pray, MD. It was created on his behalf by Rae Lips, a  trained medical scribe. The creation of this record is based on the scribe's personal observations and the provider's statements to them. This document has been checked and approved by the attending provider.

## 2018-06-25 ENCOUNTER — Encounter: Payer: Self-pay | Admitting: Radiation Oncology

## 2018-06-25 ENCOUNTER — Other Ambulatory Visit: Payer: PPO | Admitting: Nurse Practitioner

## 2018-06-25 ENCOUNTER — Ambulatory Visit: Payer: PPO | Admitting: Radiation Oncology

## 2018-06-25 ENCOUNTER — Telehealth: Payer: Self-pay

## 2018-06-25 DIAGNOSIS — E43 Unspecified severe protein-calorie malnutrition: Secondary | ICD-10-CM | POA: Diagnosis not present

## 2018-06-25 DIAGNOSIS — F339 Major depressive disorder, recurrent, unspecified: Secondary | ICD-10-CM | POA: Diagnosis not present

## 2018-06-25 DIAGNOSIS — F0390 Unspecified dementia without behavioral disturbance: Secondary | ICD-10-CM

## 2018-06-25 DIAGNOSIS — Z7189 Other specified counseling: Secondary | ICD-10-CM | POA: Diagnosis not present

## 2018-06-25 DIAGNOSIS — K1379 Other lesions of oral mucosa: Secondary | ICD-10-CM | POA: Diagnosis not present

## 2018-06-25 DIAGNOSIS — R269 Unspecified abnormalities of gait and mobility: Secondary | ICD-10-CM | POA: Diagnosis not present

## 2018-06-25 DIAGNOSIS — G894 Chronic pain syndrome: Secondary | ICD-10-CM | POA: Diagnosis not present

## 2018-06-25 DIAGNOSIS — R634 Abnormal weight loss: Secondary | ICD-10-CM

## 2018-06-25 DIAGNOSIS — Z515 Encounter for palliative care: Secondary | ICD-10-CM | POA: Diagnosis not present

## 2018-06-25 NOTE — Progress Notes (Signed)
[facility] Telephone: [phone] Fax: [phone]  PATIENT NAME: George Reid DOB: 12/27/1924 MRN: [id number]  PRIMARY CARE PROVIDER:   Burnard Bunting, MD  REFERRING PROVIDER:  Burnard Bunting, MD [address] [place], [place] [address]  RESPONSIBLE PARTY:   Doroteo Glassman (daughter) [phone] (cell)    HISTORY OF PRESENT ILLNESS:  George Reid is a 85 y.o. 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 Palliative Care was asked to help address goals of care.     ASSESSMENT/PLAN/RECOMMENDATIONS Mouth pain/ pain with eating/chewining Felt 2/2 radiation to left ear/patient edentulous -H/o esophageal dilatations x2  -failure to thrive -weight loss -protein-calorie malnutrition   -daughter is pureeing food for comfort -patient's intake is poor 2/2 loss of appetite -e  -consider lidocaine viscous solution 2%; swish and spit 57ml 15 min prior to eating or drinking and Q4hr PRN for mouth pain(do not exceed 8 doses/day) -attempt to drink ensure if patient does not eat meal -consider mirtazapine 7.5mg  at bedtime for appetite stimulation may also help with patient's sleep   Left facial droop/left eye lagophthalmos/ ectropion; left eye pain -currently using eye drops for comfort but still has eye pain; vision is nearly gone in the left eye (only see faint shadow's) -patient was evaluated by ophthalmology who recommended stiching eye lids closed to prevent corneal damage/infection -Educated patient and daughter on the recommendations made by ophthalmology; daughter understood necessity of procedure and says she will call and make appt with opthalmology  -continue eye drops  Polypharmacy; difficulty swallowing medications -patient taking  medications very slowly -consider discontinuation of atorvastatin due to patient's advanced and and high risk vs  Low benefit. -spoke with patient about it and both agree with recommendation  Dementia w/o behavioral disturbance  -gait disorder with frequent falls -patient forgets to use rolling walker -patient with sundowning with increased confusion at night -continue supportive care -educated daughter on safety tips to prevent patient from leaving house unattended. [place] very [place] to busy street. -encouraged patient to use walker and risk of not  Hypothyroidism GERD CAD/previous MI depression -continue omeprazole,ASA lexapro and synthroid per PCP   Chronic pain, LBP s/p Lumbar Lam./generalized pain/OA  -New onset PaFib in 08/2017 s/p cystoscopy/lithotripsy -patient with chronic generalized pain -continue PRN acetaminophen and tramadol if needed  -continue veramapamil -f/u with Cardiology   Advanced Care Planning -Detailed discussion with patient and daughter about DNR and MOST -Patient desires DNR/comfort/ possible antibiotic's no IVF or feeding tube -Take DNR and Most to next MD appointment to have scanned into epic.      I spent 90 minutes providing this consultation,  from 14:00 to 15:30. More than 50% of the time in this consultation was spent coordinating communication.    CODE STATUS: DNR/comfort ; possible antibiotic's; no IVF's or Feeding tube  PPS: weak 50% HOSPICE ELIGIBILITY/DIAGNOSIS: TBD  PAST MEDICAL HISTORY:  Past Medical History:  Diagnosis Date  . Arthritis   . Depression   . GERD (gastroesophageal reflux disease)   . Hyperthyroidism   . Myocardial infarction [facility]) 1992    SOCIAL HX:  Social History   Tobacco Use  . Smoking status: Never Smoker  . Smokeless tobacco: Never Used  Substance Use Topics  . Alcohol use: No    ALLERGIES: No Known Allergies   PERTINENT MEDICATIONS:  Outpatient  Encounter Medications as of 06/25/2018  Medication Sig   . aspirin 81 MG tablet Take 81 mg by mouth daily.  Marland Kitchen atorvastatin (LIPITOR) 40 MG tablet Take 1 tablet (40 mg total) by mouth daily at 6 PM.  . escitalopram (LEXAPRO) 10 MG tablet Take 10 mg by mouth daily.  . feeding supplement, ENSURE ENLIVE, (ENSURE ENLIVE) LIQD Take 237 mLs by mouth 2 (two) times daily between meals.  Marland Kitchen levothyroxine (SYNTHROID, LEVOTHROID) 88 MCG tablet Take 88 mcg by mouth daily before breakfast.  . naproxen sodium (ALEVE) 220 MG tablet Take 220 mg by mouth 2 (two) times daily as needed.  Marland Kitchen omeprazole (PRILOSEC) 20 MG capsule Take 20 mg by mouth daily.  Marland Kitchen senna-docusate (SENOKOT-S) 8.6-50 MG tablet Take 1 tablet by mouth at bedtime as needed for mild constipation. (Patient not taking: Reported on 05/15/2018)  . traMADol (ULTRAM) 50 MG tablet Take 1 tablet (50 mg total) by mouth every 6 (six) hours as needed for moderate pain.  . verapamil (CALAN-SR) 240 MG CR tablet Take 1 tablet (240 mg total) by mouth at bedtime.   No facility-administered encounter medications on file as of 06/25/2018.     PHYSICAL EXAM:   General: NAD, frail appearing, thin, left facial paralysis/left facial droop Cardiovascular: regular rate and rhythm Pulmonary: clear ant fields Abdomen: soft, nontender, + bowel sounds GU: no suprapubic tenderness Extremities: no edema, no joint deformities Skin: lower portion of his left ear is destroyed by exophytic tumor that is estimated to be approximately 6 x 6 cm  Neurological: Weakness but otherwise nonfocal  Stephanie G Martinique, NP

## 2018-06-25 NOTE — Telephone Encounter (Signed)
Returned call to daughter. Daughter states palliative care RN is coming to home today for consult at 1400 and would like to cancel today's f/u appt. Daughter states pt with mucositis. Conveyed to daughter that if Palliative care did not address, this RN would discuss with Dr. Sondra Come. Encouraged daughter to call this RN after consult to relay what was recommended. Daughter verbalized understanding and agreement. Loma Sousa, RN BSN

## 2018-06-26 ENCOUNTER — Encounter: Payer: Self-pay | Admitting: Nurse Practitioner

## 2018-07-23 ENCOUNTER — Ambulatory Visit: Payer: Self-pay | Admitting: Radiation Oncology

## 2018-09-17 DIAGNOSIS — I5022 Chronic systolic (congestive) heart failure: Secondary | ICD-10-CM | POA: Diagnosis not present

## 2018-09-17 DIAGNOSIS — M1990 Unspecified osteoarthritis, unspecified site: Secondary | ICD-10-CM | POA: Diagnosis not present

## 2018-09-17 DIAGNOSIS — I77819 Aortic ectasia, unspecified site: Secondary | ICD-10-CM | POA: Diagnosis not present

## 2018-09-17 DIAGNOSIS — G510 Bell's palsy: Secondary | ICD-10-CM | POA: Diagnosis not present

## 2018-09-17 DIAGNOSIS — I130 Hypertensive heart and chronic kidney disease with heart failure and stage 1 through stage 4 chronic kidney disease, or unspecified chronic kidney disease: Secondary | ICD-10-CM | POA: Diagnosis not present

## 2018-09-17 DIAGNOSIS — E039 Hypothyroidism, unspecified: Secondary | ICD-10-CM | POA: Diagnosis not present

## 2018-09-17 DIAGNOSIS — M545 Low back pain: Secondary | ICD-10-CM | POA: Diagnosis not present

## 2018-09-17 DIAGNOSIS — I2510 Atherosclerotic heart disease of native coronary artery without angina pectoris: Secondary | ICD-10-CM | POA: Diagnosis not present

## 2018-09-17 DIAGNOSIS — I10 Essential (primary) hypertension: Secondary | ICD-10-CM | POA: Diagnosis not present

## 2018-09-17 DIAGNOSIS — M546 Pain in thoracic spine: Secondary | ICD-10-CM | POA: Diagnosis not present

## 2018-09-17 DIAGNOSIS — E785 Hyperlipidemia, unspecified: Secondary | ICD-10-CM | POA: Diagnosis not present

## 2018-09-24 ENCOUNTER — Telehealth: Payer: Self-pay

## 2018-09-24 NOTE — Telephone Encounter (Signed)
Received message to call daughter. Phone call placed to daughter, Jana Half. Jana Half shared that patient has demonstrated decline as follows: Functional decline with increased weakness. He has had daily falls per daughter.  Meal intake has declined significantly. He is now fed spoonfuls of fluids and food. Daughter shared that he is only eating a few spoonfuls daily. His weight is approximately 95 pounds. This is approximately a 25 pound weight loss since December. He is no longer able to take his medicine as he is having difficulty swallowing. Patient has complained of burning upon urination and was prescribed antibiotics on Friday 4/10/20202 but has not been able to take them.  Patient's speech is mostly unintelligible per report of daughter.  Daughter thought that MD had ordered hospice eval. Per referral center, order from Dr. Reynaldo Minium was for palliative care. Will follow up with Palliative NP for further direction

## 2018-09-24 NOTE — Telephone Encounter (Signed)
Received return call from [place] with PCP office. Dr. Reynaldo Minium is in agreement with hospice and order for eval was sent to referral center.

## 2018-09-24 NOTE — Telephone Encounter (Signed)
Message left for medical assistants to Dr. Reynaldo Minium to request order for hospice eval.

## 2018-10-12 DEATH — deceased

## 2019-11-13 IMAGING — CT CT RENAL STONE PROTOCOL
2 of 4 series · 15 of 46 positions shown, 17 images · non-contrast
Comparison: None.

CLINICAL DATA: [AGE] with flank pain.

EXAM:
CT ABDOMEN AND PELVIS WITHOUT CONTRAST
TECHNIQUE: Multidetector CT imaging of the abdomen and pelvis was performed
following the standard protocol without IV contrast.

[Series 3: stone study 5.0 i30f 2 · axial · 0.72mm/px · z∈[+864,+1214]mm · 12 of 81 slices shown, 14 images]
[im 7/81  soft-tissue]
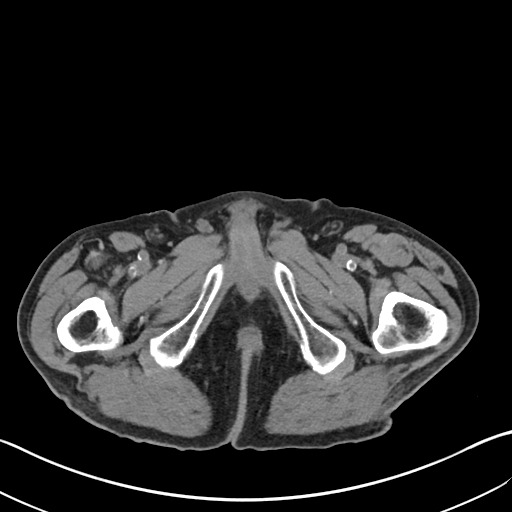
[im 7/81  bone]
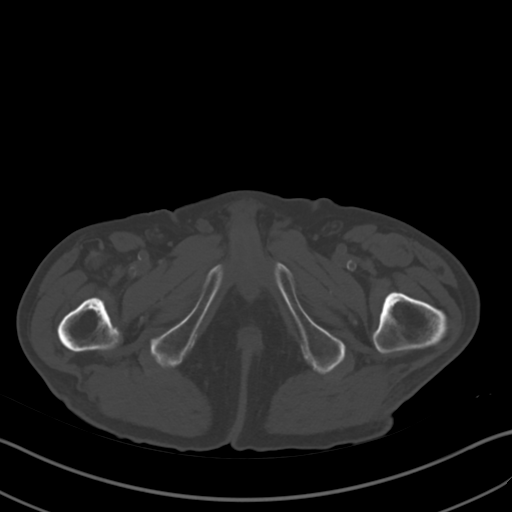
[im 13/81  soft-tissue]
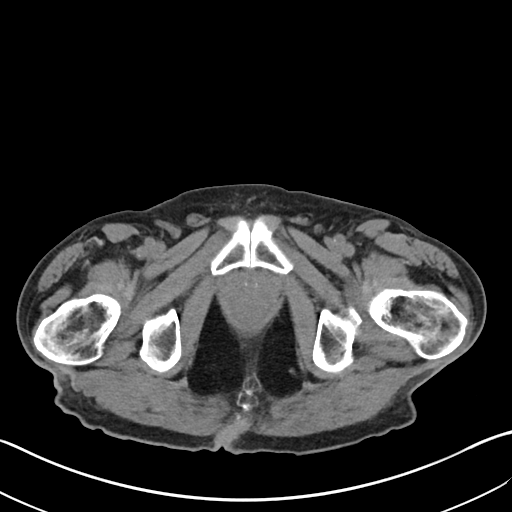
[im 20/81  soft-tissue]
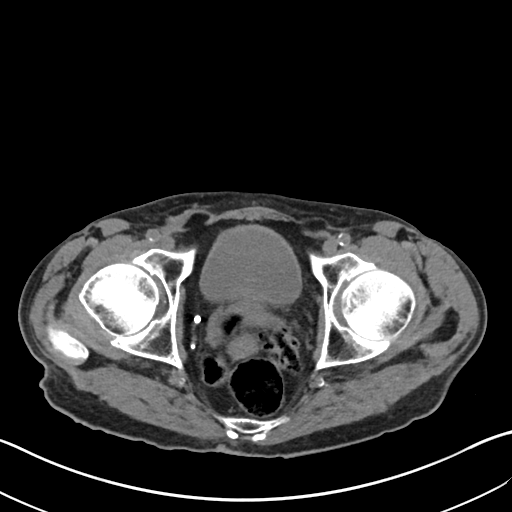
[im 26/81  soft-tissue]
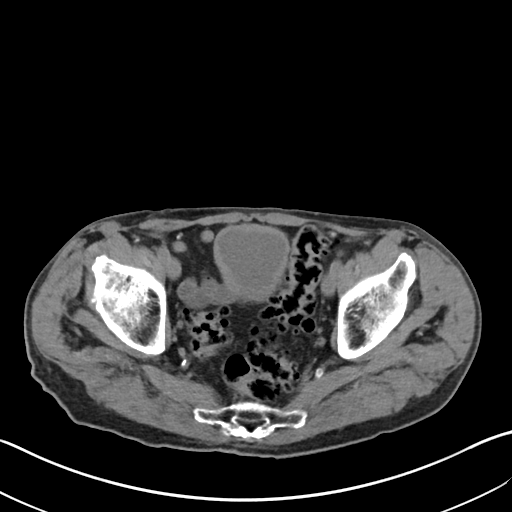
[im 33/81  soft-tissue]
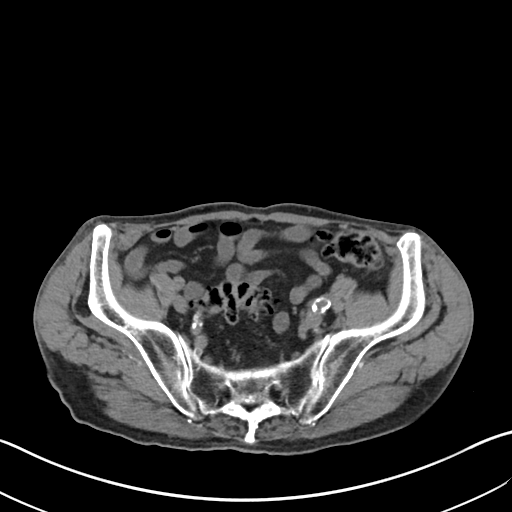
[im 39/81  soft-tissue]
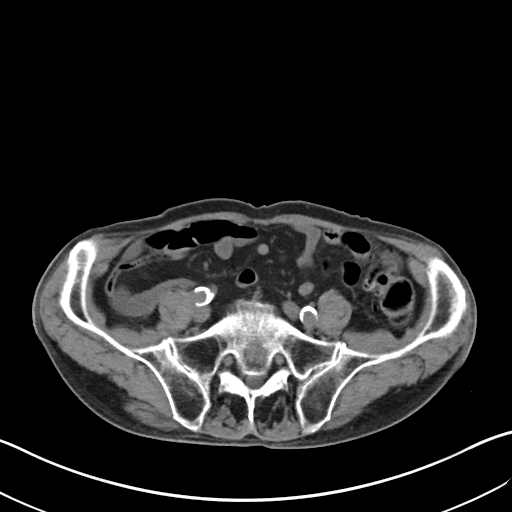
[im 45/81  soft-tissue]
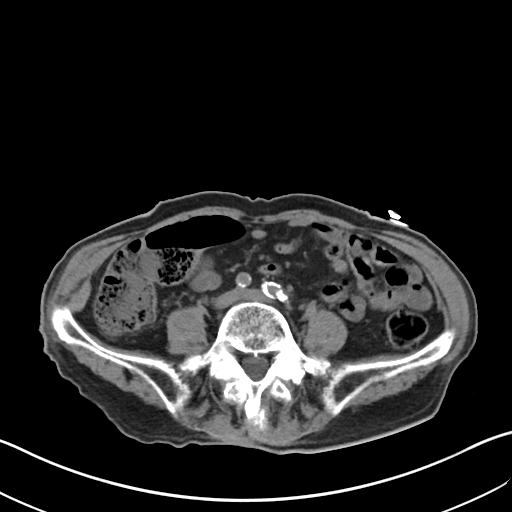
[im 52/81  soft-tissue]
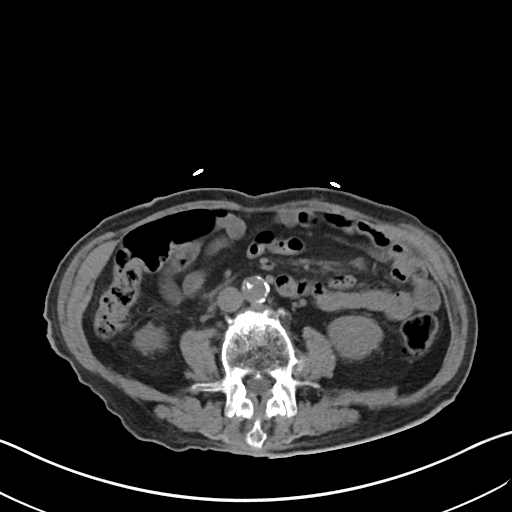
[im 58/81  soft-tissue]
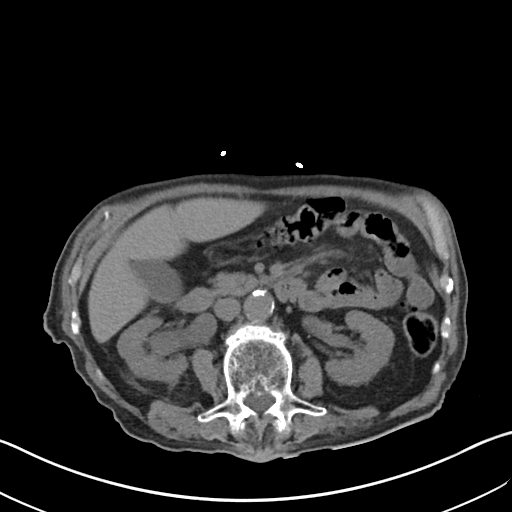
[im 58/81  bone]
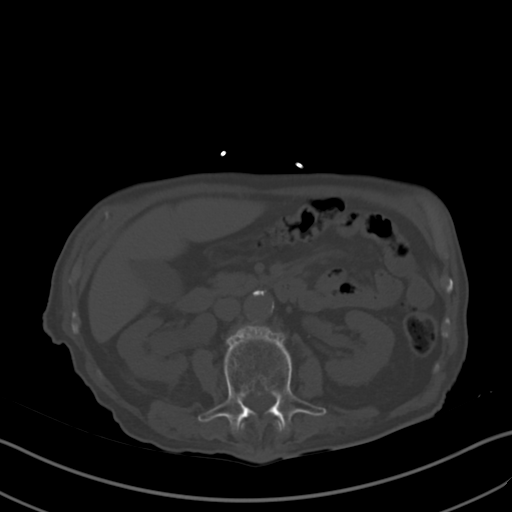
[im 65/81  soft-tissue]
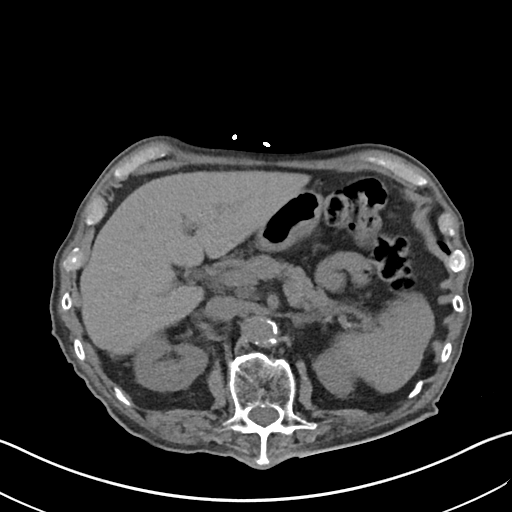
[im 71/81  soft-tissue]
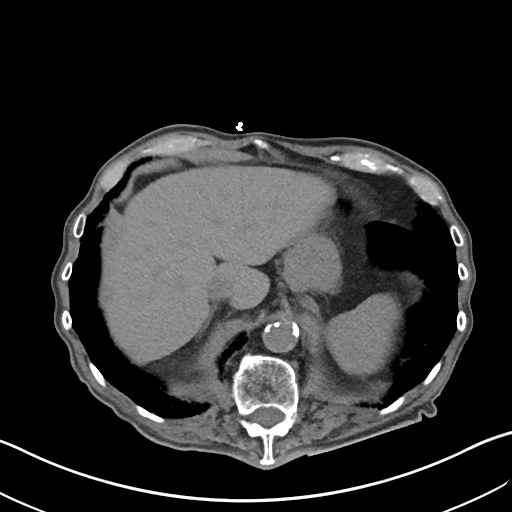
[im 77/81  soft-tissue]
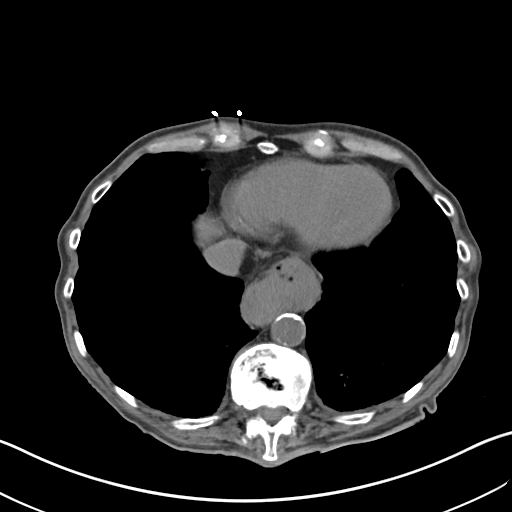

[Series 6: coronal soft tissue · coronal · 0.67mm/px · 3 of 98 slices shown]
[im 33/98  soft-tissue]
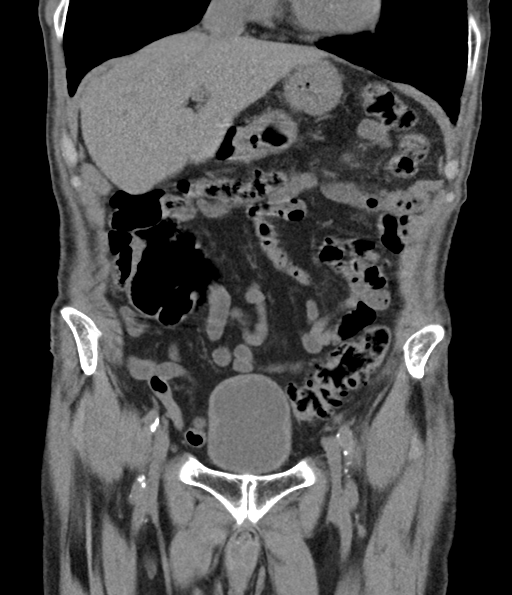
[im 44/98  soft-tissue]
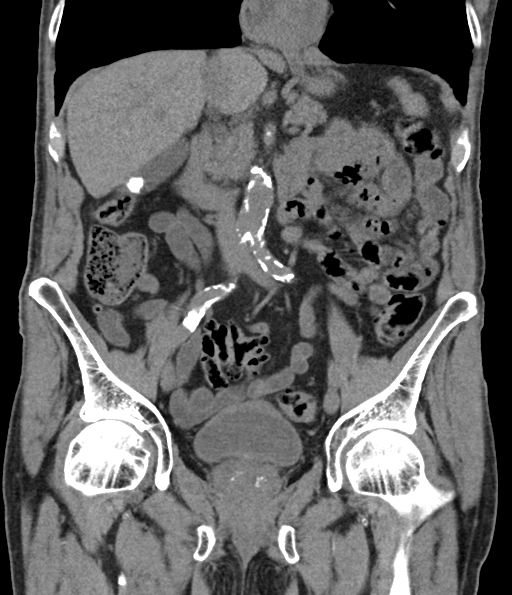
[im 54/98  soft-tissue]
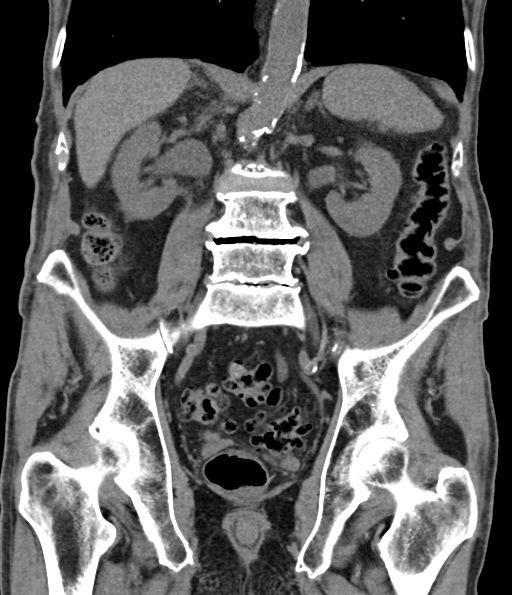

[15 of 46 positions shown; findings below may reference images not displayed]

FINDINGS: Lower chest: Chronic changes with scattered pulmonary cysts.
Subpleural reticulations suggest fibrosis. No pleural fluid. There
is moderate hiatal hernia.

Hepatobiliary: Calcified gallstones within physiologically distended
gallbladder. No pericholecystic inflammation. No evidence of focal
hepatic lesion allowing for lack contrast.

Pancreas: No ductal dilatation or inflammation.

Spleen: Normal in size without focal abnormality.

Adrenals/Urinary Tract: No adrenal nodule.

Ovoid 10 x 14 mm stone in the right ureteropelvic junction with
moderate right hydroureteronephrosis. Mild right perinephric edema.
Right ureter distally is decompressed.

Probable extrarenal pelvis configuration of the left kidney without
left hydronephrosis. No left urolithiasis.

Urinary bladder is partially distended. Questionable wall thickening
of the bladder dome. No bladder stones.

Stomach/Bowel: Colonic diverticulosis, prominent in the distal
descending and sigmoid colon. No diverticulitis. Normal appendix. No
bowel inflammation, wall thickening or obstruction. Moderate hiatal
hernia. Stomach is otherwise nondistended.

Vascular/Lymphatic: Dense aortic and branch atherosclerosis. Mild
ectasia of the aorta measuring 2.6 cm. Small mesenteric nodes in the
left abdomen with minimal mesenteric edema, likely reactive. No
enlarged abdominal or pelvic lymph nodes.

Reproductive: Prominent prostate gland spanning 4.6 cm.

Other: No ascites or free air.  Intra-abdominal abscess.

Musculoskeletal: T12 compression fracture is chronic, however has
likely progressed from December 2014 lumbar spine radiograph. Multilevel
degenerative change in the lumbar spine. The bones are under
mineralized.
IMPRESSION: 1. Obstructing 10 x 14 mm stone at the right ureteropelvic junction
with moderate hydronephrosis.
2. Incidental findings of gallstones, moderate hiatal hernia, and
colonic diverticulosis.
3. Chronic T12 compression fracture, with mild progressive loss of
height from December 2014 lumbar spine radiograph.
4. Aortic Atherosclerosis ([id number]). Aortic ectasia maximal
dimension 2.6 cm. Ectatic abdominal aorta at risk for aneurysm
development. Recommend followup by ultrasound in 5 years (giving
consideration for patient's advanced age. This recommendation
follows ACR consensus guidelines: White Paper of the ACR Incidental
Findings Committee II on Vascular Findings. [HOSPITAL] 7064;
5. Subpleural reticular changes in the lung bases suggest fibrosis.
